# Patient Record
Sex: Female | Born: 1965 | Race: White | Hispanic: No | Marital: Married | Smoking: Never smoker
Health system: Southern US, Community
[De-identification: ages and names within clinical notes are randomized; demographics above are authoritative.]

## PROBLEM LIST (undated history)

## (undated) DIAGNOSIS — J309 Allergic rhinitis, unspecified: Secondary | ICD-10-CM

## (undated) DIAGNOSIS — B019 Varicella without complication: Secondary | ICD-10-CM

## (undated) DIAGNOSIS — K219 Gastro-esophageal reflux disease without esophagitis: Secondary | ICD-10-CM

## (undated) DIAGNOSIS — Z8489 Family history of other specified conditions: Secondary | ICD-10-CM

## (undated) DIAGNOSIS — R638 Other symptoms and signs concerning food and fluid intake: Secondary | ICD-10-CM

## (undated) HISTORY — DX: Other symptoms and signs concerning food and fluid intake: R63.8

## (undated) HISTORY — DX: Varicella without complication: B01.9

## (undated) HISTORY — DX: Allergic rhinitis, unspecified: J30.9

## (undated) HISTORY — DX: Gastro-esophageal reflux disease without esophagitis: K21.9

## (undated) HISTORY — PX: NO PAST SURGERIES: SHX2092

---

## 2014-06-12 LAB — HM MAMMOGRAPHY

## 2014-06-25 LAB — HM PAP SMEAR: HM Pap smear: NEGATIVE

## 2015-02-04 ENCOUNTER — Ambulatory Visit (INDEPENDENT_AMBULATORY_CARE_PROVIDER_SITE_OTHER): Payer: 59 | Admitting: Primary Care

## 2015-02-04 ENCOUNTER — Encounter (INDEPENDENT_AMBULATORY_CARE_PROVIDER_SITE_OTHER): Payer: Self-pay

## 2015-02-04 ENCOUNTER — Encounter: Payer: Self-pay | Admitting: Primary Care

## 2015-02-04 VITALS — BP 116/76 | HR 62 | Temp 97.7°F | Ht 63.0 in | Wt 163.8 lb

## 2015-02-04 DIAGNOSIS — K219 Gastro-esophageal reflux disease without esophagitis: Secondary | ICD-10-CM | POA: Diagnosis not present

## 2015-02-04 DIAGNOSIS — R002 Palpitations: Secondary | ICD-10-CM | POA: Diagnosis not present

## 2015-02-04 DIAGNOSIS — J302 Other seasonal allergic rhinitis: Secondary | ICD-10-CM

## 2015-02-04 DIAGNOSIS — Z833 Family history of diabetes mellitus: Secondary | ICD-10-CM | POA: Diagnosis not present

## 2015-02-04 DIAGNOSIS — J309 Allergic rhinitis, unspecified: Secondary | ICD-10-CM | POA: Insufficient documentation

## 2015-02-04 DIAGNOSIS — Z0000 Encounter for general adult medical examination without abnormal findings: Secondary | ICD-10-CM

## 2015-02-04 NOTE — Assessment & Plan Note (Signed)
Managed on daily nexium with relief of symptoms.

## 2015-02-04 NOTE — Assessment & Plan Note (Signed)
Present intermittently x 1 year. Endorses high stress job with increased stress at work. ECG today unremarkable, will complete TSH, CBC, CMP to rule out metabolic causes. Suspect this is stress related. GAD 7 score of 12, although she doesn't feel as though she's extremely anxious. Will continue to monitor.

## 2015-02-04 NOTE — Progress Notes (Signed)
Subjective:    Patient ID: Christine Schroeder, female    DOB: 09-20-1965, 49 y.o.   MRN: [id number]  HPI  Christine Schroeder is a 49 year old female who presents today to establish care and discuss the problems mentioned below. Will obtain old records. Her last full physical was over 2-3 years ago but she routinely sees her GYN.  1) GERD/Ulcers: Currently managed on Nexium 20 mg daily with complete relief. Her symptoms include epigastric discomfort.   2) Allergic Rhinitis: Currently managed on Zyrtec 10 mg for several years.  3) Puritis: Currently takes Benadryl 25 mg every morning prior to her shower as she will experience moderate intensity itching to bilateral lower extremities from the patella down to her feet. She is seeing an allergist and dermatologist for this.  4) Family history of diabetes: Present on mother's side, but mother does not have. She has her fasting blood sugar checked every year.   5) Palpitations: Intermittently for the past year. They occur during the day and at night without regards to activity. Will experience sensations of her heart pounding that will last for several minutes. She endorses a high stress job and has been experiencing an increased amount of stress at work. GAD score of 12 in clinic today. Denies shortness of breath, dizziness, headaches, depression. She doesn't feel as though she has difficulty with anxiety.   Review of Systems  Constitutional: Negative for unexpected weight change.  HENT: Negative for rhinorrhea.   Respiratory: Negative for cough and shortness of breath.   Cardiovascular: Negative for chest pain.  Gastrointestinal: Negative for diarrhea and constipation.  Musculoskeletal: Negative for myalgias and arthralgias.  Skin: Negative for rash.  Allergic/Immunologic: Positive for environmental allergies.  Neurological: Negative for dizziness, numbness and headaches.  Psychiatric/Behavioral:       Denies concerns for anxiety or depression.        Past Medical History  Diagnosis Date  . Chickenpox   . Allergic rhinitis   . GERD (gastroesophageal reflux disease)     Social History   Social History  . Marital Status: Unknown    Spouse Name: N/A  . Number of Children: N/A  . Years of Education: N/A   Occupational History  . Not on file.   Social History Main Topics  . Smoking status: Never Smoker   . Smokeless tobacco: Not on file  . Alcohol Use: No  . Drug Use: Not on file  . Sexual Activity: Not on file   Other Topics Concern  . Not on file   Social History Narrative   Married.   No children.   Works for [employer] in [employer].   Enjoys hiking, spending time outdoors, reading, golfing.     History reviewed. No pertinent past surgical history.  Family History  Problem Relation Age of Onset  . Hypertension Mother   . Heart disease Maternal Uncle   . Stroke Maternal Grandmother   . Diabetes Maternal Grandmother   . Diabetes Maternal Grandfather     Allergies  Allergen Reactions  . Etodolac Other (See Comments)  . Meloxicam Other (See Comments)  . Penicillins Hives  . Prednisone Rash    Abdominal pain    No current outpatient prescriptions on file prior to visit.   No current facility-administered medications on file prior to visit.    BP 116/76 mmHg  Pulse 62  Temp(Src) 97.7 F (36.5 C) (Oral)  Ht 5\' 3"  (1.6 m)  Wt 163 lb 12.8 oz (74.299  kg)  BMI 29.02 kg/m2  SpO2 98%  LMP 02/03/2015    Objective:   Physical Exam  Constitutional: She is oriented to person, place, and time. She appears well-nourished.  Cardiovascular: Normal rate and regular rhythm.   Pulmonary/Chest: Effort normal and breath sounds normal.  Neurological: She is alert and oriented to person, place, and time.  Skin: Skin is warm and dry.  Psychiatric: She has a normal mood and affect.          Assessment & Plan:  Puritis:  Present to bilateral lower extremities each time she  showers. Currently managed by dermatology and allergist.  ECG: Sinus bradycardia at 56. No ST elevation, PAC's, PVC's.

## 2015-02-04 NOTE — Patient Instructions (Signed)
Your ECG looks good. Your palpitations are likely stress related but we will check labs today to rule out other causes. Please work to reduce stress.  Complete lab work prior to leaving today. I will notify you of your results.  Please schedule a physical with me before the end of the year. You will also schedule a lab only appointment one week prior. We will discuss your lab results during your physical.  It was a pleasure to meet you today! Please don't hesitate to call me with any questions. Welcome to [facility]!

## 2015-02-04 NOTE — Progress Notes (Signed)
Pre visit review using our clinic review tool, if applicable. No additional management support is needed unless otherwise documented below in the visit note. 

## 2015-02-04 NOTE — Assessment & Plan Note (Signed)
Managed on daily zyrtec 10 mg with control of symptoms.

## 2015-02-05 LAB — COMPREHENSIVE METABOLIC PANEL
ALT: 21 IU/L (ref 0–32)
AST: 26 IU/L (ref 0–40)
Albumin/Globulin Ratio: 1.7 (ref 1.1–2.5)
Albumin: 4.3 g/dL (ref 3.5–5.5)
Alkaline Phosphatase: 28 IU/L — ABNORMAL LOW (ref 39–117)
BUN/Creatinine Ratio: 21 (ref 9–23)
BUN: 16 mg/dL (ref 6–24)
Bilirubin Total: 0.2 mg/dL (ref 0.0–1.2)
CO2: 23 mmol/L (ref 18–29)
Calcium: 9.5 mg/dL (ref 8.7–10.2)
Chloride: 101 mmol/L (ref 97–106)
Creatinine, Ser: 0.75 mg/dL (ref 0.57–1.00)
GFR calc Af Amer: 109 mL/min/{1.73_m2} (ref >59)
GFR calc non Af Amer: 95 mL/min/{1.73_m2} (ref >59)
Globulin, Total: 2.6 g/dL (ref 1.5–4.5)
Glucose: 87 mg/dL (ref 65–99)
Potassium: 4.2 mmol/L (ref 3.5–5.2)
Sodium: 143 mmol/L (ref 136–144)
Total Protein: 6.9 g/dL (ref 6.0–8.5)

## 2015-02-05 LAB — CBC WITH DIFFERENTIAL/PLATELET
Basophils Absolute: 0 10*3/uL (ref 0.0–0.2)
Basos: 0 %
EOS (ABSOLUTE): 0.3 10*3/uL (ref 0.0–0.4)
Eos: 3 %
Hematocrit: 40.6 % (ref 34.0–46.6)
Hemoglobin: 13.8 g/dL (ref 11.1–15.9)
Immature Grans (Abs): 0 10*3/uL (ref 0.0–0.1)
Immature Granulocytes: 0 %
Lymphocytes Absolute: 3.2 10*3/uL — ABNORMAL HIGH (ref 0.7–3.1)
Lymphs: 35 %
MCH: 30.7 pg (ref 26.6–33.0)
MCHC: 34 g/dL (ref 31.5–35.7)
MCV: 90 fL (ref 79–97)
Monocytes Absolute: 0.8 10*3/uL (ref 0.1–0.9)
Monocytes: 8 %
Neutrophils Absolute: 4.9 10*3/uL (ref 1.4–7.0)
Neutrophils: 54 %
Platelets: 301 10*3/uL (ref 150–379)
RBC: 4.5 x10E6/uL (ref 3.77–5.28)
RDW: 12.8 % (ref 12.3–15.4)
WBC: 9.2 10*3/uL (ref 3.4–10.8)

## 2015-02-05 LAB — LIPID PANEL
Chol/HDL Ratio: 4 ratio units (ref 0.0–4.4)
Cholesterol, Total: 200 mg/dL — ABNORMAL HIGH (ref 100–199)
HDL: 50 mg/dL (ref >39)
LDL Calculated: 135 mg/dL — ABNORMAL HIGH (ref 0–99)
Triglycerides: 77 mg/dL (ref 0–149)
VLDL Cholesterol Cal: 15 mg/dL (ref 5–40)

## 2015-02-05 LAB — HEMOGLOBIN A1C
Est. average glucose Bld gHb Est-mCnc: 120 mg/dL
Hgb A1c MFr Bld: 5.8 % — ABNORMAL HIGH (ref 4.8–5.6)

## 2015-02-05 LAB — TSH: TSH: 0.838 u[IU]/mL (ref 0.450–4.500)

## 2015-02-05 LAB — VITAMIN B12: Vitamin B-12: 621 pg/mL (ref 211–946)

## 2015-02-05 LAB — VITAMIN D 25 HYDROXY (VIT D DEFICIENCY, FRACTURES): Vit D, 25-Hydroxy: 39.4 ng/mL (ref 30.0–100.0)

## 2015-02-09 ENCOUNTER — Encounter: Payer: Self-pay | Admitting: *Deleted

## 2015-03-03 ENCOUNTER — Ambulatory Visit (INDEPENDENT_AMBULATORY_CARE_PROVIDER_SITE_OTHER): Payer: 59 | Admitting: Family Medicine

## 2015-03-03 ENCOUNTER — Other Ambulatory Visit: Payer: Self-pay | Admitting: Family Medicine

## 2015-03-03 ENCOUNTER — Encounter: Payer: Self-pay | Admitting: Family Medicine

## 2015-03-03 VITALS — BP 112/78 | HR 94 | Temp 98.4°F | Ht 63.0 in | Wt 157.1 lb

## 2015-03-03 DIAGNOSIS — J029 Acute pharyngitis, unspecified: Secondary | ICD-10-CM | POA: Insufficient documentation

## 2015-03-03 LAB — POCT RAPID STREP A (OFFICE): Rapid Strep A Screen: NEGATIVE

## 2015-03-03 MED ORDER — AZITHROMYCIN 250 MG PO TABS: ORAL_TABLET | ORAL | Status: DC

## 2015-03-03 NOTE — Patient Instructions (Signed)
Your strep test was negative.  We are sending a culture for confirmation.  This is likely viral.  Given the holiday, I have sent in an antibiotic in case you fail to improve.  Take care  Dr. Lacinda Axon

## 2015-03-03 NOTE — Progress Notes (Signed)
   Subjective:  Patient ID: Christine Schroeder, female    DOB: 04-23-1965  Age: 49 y.o. MRN: [id number]  CC: Sore throat  HPI:  49 year old female presents to clinic today for an acute visit with complaints of sore throat.  Sore throat  Patient reports she developed severe sore throat earlier this morning.  She states that it is slowly been worsening.  No associated fevers or chills.  No other associated symptoms.  No exacerbating or relieving factors. No interventions tried.  Social Hx   Social History   Social History  . Marital Status: Unknown    Spouse Name: N/A  . Number of Children: N/A  . Years of Education: N/A   Social History Main Topics  . Smoking status: Never Smoker   . Smokeless tobacco: None  . Alcohol Use: No  . Drug Use: None  . Sexual Activity: Not Asked   Other Topics Concern  . None   Social History Narrative   Married.   No children.   Works for [employer] in [employer].   Enjoys hiking, spending time outdoors, reading, golfing.     Review of Systems  Constitutional: Negative for fever.  HENT: Positive for sore throat.    Objective:  BP 112/78 mmHg  Pulse 94  Temp(Src) 98.4 F (36.9 C) (Oral)  Ht 5\' 3"  (1.6 m)  Wt 157 lb 2 oz (71.271 kg)  BMI 27.84 kg/m2  SpO2 98%  LMP 02/03/2015  BP/Weight 03/03/2015 [id number]  Systolic BP [id number]  Diastolic BP 78 76  Wt. (Lbs) 157.13 163.8  BMI 27.84 29.02   Physical Exam  Constitutional: She appears well-developed. No distress.  HENT:  Head: Normocephalic and atraumatic.  Oropharynx with mild erythema. No exudate. Normal TMs bilaterally.  Cardiovascular: Normal rate and regular rhythm.   Pulmonary/Chest: Effort normal and breath sounds normal.  Neurological: She is alert.  Psychiatric: She has a normal mood and affect.  Vitals reviewed.   Assessment & Plan:   Problem List Items Addressed This Visit    Acute pharyngitis - Primary    Rapid strep negative. Sending  culture. Likely viral in origin. Patient adamant about antibiotics given the holiday coming up. I informed her that this is likely viral. Rx for azithromycin given if she fails to improve or worsens while we wait on the culture.      Relevant Orders   POCT rapid strep A (Completed)   Throat culture [facility])      Meds ordered this encounter  Medications  . azithromycin (ZITHROMAX) 250 MG tablet    Sig: 2 tablets on Day 1; then 1 tablet daily on days 2-5.    Dispense:  6 tablet    Refill:  0    Do not fill before 11/23.    Follow-up:   [place]

## 2015-03-03 NOTE — Assessment & Plan Note (Signed)
Rapid strep negative. Sending culture. Likely viral in origin. Patient adamant about antibiotics given the holiday coming up. I informed her that this is likely viral. Rx for azithromycin given if she fails to improve or worsens while we wait on the culture.

## 2015-03-05 LAB — CULTURE, GROUP A STREP: Organism ID, Bacteria: NORMAL

## 2015-03-21 LAB — LIPID PANEL
Chol/HDL Ratio: 3.7 ratio units (ref 0.0–4.4)
Cholesterol, Total: 168 mg/dL (ref 100–199)
HDL: 46 mg/dL (ref >39)
LDL Calculated: 103 mg/dL — ABNORMAL HIGH (ref 0–99)
Triglycerides: 94 mg/dL (ref 0–149)
VLDL Cholesterol Cal: 19 mg/dL (ref 5–40)

## 2015-03-21 LAB — HEMOGLOBIN A1C
Est. average glucose Bld gHb Est-mCnc: 120 mg/dL
Hgb A1c MFr Bld: 5.8 % — ABNORMAL HIGH (ref 4.8–5.6)

## 2015-03-21 LAB — VITAMIN D 25 HYDROXY (VIT D DEFICIENCY, FRACTURES): Vit D, 25-Hydroxy: 39.1 ng/mL (ref 30.0–100.0)

## 2015-03-21 LAB — VITAMIN B12: Vitamin B-12: >2000 pg/mL — ABNORMAL HIGH (ref 211–946)

## 2015-03-27 ENCOUNTER — Ambulatory Visit (INDEPENDENT_AMBULATORY_CARE_PROVIDER_SITE_OTHER): Payer: 59 | Admitting: Primary Care

## 2015-03-27 ENCOUNTER — Encounter: Payer: Self-pay | Admitting: Primary Care

## 2015-03-27 VITALS — BP 114/70 | HR 67 | Temp 98.0°F | Ht 63.0 in | Wt 155.0 lb

## 2015-03-27 DIAGNOSIS — Z0000 Encounter for general adult medical examination without abnormal findings: Secondary | ICD-10-CM | POA: Diagnosis not present

## 2015-03-27 DIAGNOSIS — R7303 Prediabetes: Secondary | ICD-10-CM | POA: Diagnosis not present

## 2015-03-27 NOTE — Progress Notes (Signed)
Pre visit review using our clinic review tool, if applicable. No additional management support is needed unless otherwise documented below in the visit note. 

## 2015-03-27 NOTE — Assessment & Plan Note (Signed)
A1C of 5.8 today. She is working towards a healthy lifestyle through exercise and diet. Commended her on her weight loss efforts. Will recheck in 3 months.

## 2015-03-27 NOTE — Patient Instructions (Signed)
Continue your efforts towards a healthy lifestyle through healthy diet and exercise.  Schedule a lab only appointment in 3 months for recheck of the diabetes test.  Follow up in 1 year for repeat physical or sooner if needed.  It was a pleasure to see you today!

## 2015-03-27 NOTE — Progress Notes (Signed)
Subjective:    Patient ID: Christine Schroeder, female    DOB: 02-03-1966, 49 y.o.   MRN: [id number]  HPI  Christine Schroeder is a 49 year old female who presents today for complete physical.  Immunizations: -Tetanus: Unsure, believes it's been over 10 years. Declines today. -Influenza: Completed in November 2016   Diet: Endorses a healthy diet and is under a weight loss management program. She is under her second month and follows once monthly.  Her diet consists of: Breakfast: Bagel, peanut butter, sugar free jam. Almond milk, fruit. Lunch: 2-3 oz of protein, starch (toast, starchy veggie), green vegetable or salad Dinner: 2-3 oz protein, no starches, 2 veggies. Snacks: Fruit, popcorn Desserts: None Beverages: Water, unsweet tea  Exercise: She exercises 6 days weekly.  Wt Readings from Last 3 Encounters:  03/27/15 155 lb (70.308 kg)  03/03/15 157 lb 2 oz (71.271 kg)  02/04/15 163 lb 12.8 oz (74.299 kg)     Eye exam: Completed in September 2016 Dental exam: Completes semi-annually. Pap Smear: Due in March 2017. Mammogram: Completed in March 2016.   Review of Systems  Constitutional: Negative for unexpected weight change.  HENT: Negative for rhinorrhea.   Respiratory: Negative for cough and shortness of breath.   Cardiovascular: Negative for chest pain.  Gastrointestinal: Negative for diarrhea and constipation.  Genitourinary: Negative for difficulty urinating.  Musculoskeletal: Negative for myalgias and arthralgias.  Skin: Negative for rash.  Allergic/Immunologic: Negative for environmental allergies.  Neurological: Negative for dizziness, numbness and headaches.  Psychiatric/Behavioral:       Denies concerns from anxiety or depression.        Past Medical History  Diagnosis Date  . Chickenpox   . Allergic rhinitis   . GERD (gastroesophageal reflux disease)     Social History   Social History  . Marital Status: Unknown    Spouse Name: N/A  . Number of  Children: N/A  . Years of Education: N/A   Occupational History  . Not on file.   Social History Main Topics  . Smoking status: Never Smoker   . Smokeless tobacco: Not on file  . Alcohol Use: No  . Drug Use: Not on file  . Sexual Activity: Not on file   Other Topics Concern  . Not on file   Social History Narrative   Married.   No children.   Works for [employer] in [employer].   Enjoys hiking, spending time outdoors, reading, golfing.     No past surgical history on file.  Family History  Problem Relation Age of Onset  . Hypertension Mother   . Heart disease Maternal Uncle   . Stroke Maternal Grandmother   . Diabetes Maternal Grandmother   . Diabetes Maternal Grandfather     Allergies  Allergen Reactions  . Etodolac Other (See Comments)  . Meloxicam Other (See Comments)  . Penicillins Hives  . Prednisone Rash    Abdominal pain    Current Outpatient Prescriptions on File Prior to Visit  Medication Sig Dispense Refill  . cetirizine (ZYRTEC) 10 MG tablet Take 10 mg by mouth daily.     . diphenhydrAMINE (BENADRYL) 25 mg capsule Take 25 mg by mouth daily.     Marland Kitchen esomeprazole (NEXIUM) 20 MG capsule Take 20 mg by mouth daily at 12 noon.    . norgestimate-ethinyl estradiol (PREVIFEM) 0.25-35 MG-MCG tablet Take 1 tablet by mouth daily.     . vitamin B-12 (CYANOCOBALAMIN) 1000 MCG tablet Take 1,000 mcg by  mouth daily.     No current facility-administered medications on file prior to visit.    BP 114/70 mmHg  Pulse 67  Temp(Src) 98 F (36.7 C) (Oral)  Ht 5\' 3"  (1.6 m)  Wt 155 lb (70.308 kg)  BMI 27.46 kg/m2  SpO2 99%  LMP 02/24/2015    Objective:   Physical Exam  Constitutional: She is oriented to person, place, and time. She appears well-nourished.  HENT:  Right Ear: Tympanic membrane and ear canal normal.  Left Ear: Tympanic membrane and ear canal normal.  Nose: Nose normal.  Mouth/Throat: Oropharynx is clear and moist.  Eyes: Conjunctivae  and EOM are normal. Pupils are equal, round, and reactive to light.  Neck: Neck supple. No thyromegaly present.  Cardiovascular: Normal rate and regular rhythm.   No murmur heard. Pulmonary/Chest: Effort normal and breath sounds normal. She has no rales.  Abdominal: Soft. Bowel sounds are normal. There is no tenderness.  Musculoskeletal: Normal range of motion.  Lymphadenopathy:    She has no cervical adenopathy.  Neurological: She is alert and oriented to person, place, and time. She has normal reflexes. No cranial nerve deficit.  Skin: Skin is warm and dry. No rash noted.  Psychiatric: She has a normal mood and affect.          Assessment & Plan:

## 2015-03-27 NOTE — Assessment & Plan Note (Addendum)
Tdap out of date and declines today. Flu, pap, mammogram UTD. Labs with improvement in cholesterol. Will have her stop vitamin B 12. Exam unremarkable. She is working towards a healthy lifestyle with exercise and diet. Follow up in 1 year for repeat physical.

## 2015-06-02 ENCOUNTER — Telehealth: Payer: Self-pay | Admitting: Primary Care

## 2015-06-02 NOTE — Telephone Encounter (Signed)
Print and sent.

## 2015-06-02 NOTE — Telephone Encounter (Signed)
Patient misplaced her lab order for her March lab work.  Patient wants to know if lab order can be faxed to [phone].  Fax machine is next to patient in her office.

## 2015-06-02 NOTE — Telephone Encounter (Signed)
See below. Will you please print and fax?

## 2015-06-02 NOTE — Telephone Encounter (Deleted)
Done and sent  

## 2015-06-23 ENCOUNTER — Other Ambulatory Visit: Payer: Self-pay | Admitting: Primary Care

## 2015-06-23 LAB — HGB A1C W/O EAG: Hgb A1c MFr Bld: 5.7 % — ABNORMAL HIGH (ref 4.8–5.6)

## 2015-06-30 ENCOUNTER — Ambulatory Visit (INDEPENDENT_AMBULATORY_CARE_PROVIDER_SITE_OTHER): Payer: 59 | Admitting: Obstetrics and Gynecology

## 2015-06-30 ENCOUNTER — Encounter: Payer: Self-pay | Admitting: Obstetrics and Gynecology

## 2015-06-30 VITALS — BP 112/81 | HR 81 | Ht 62.0 in | Wt 147.9 lb

## 2015-06-30 DIAGNOSIS — Z1231 Encounter for screening mammogram for malignant neoplasm of breast: Secondary | ICD-10-CM

## 2015-06-30 DIAGNOSIS — Z0000 Encounter for general adult medical examination without abnormal findings: Secondary | ICD-10-CM

## 2015-06-30 DIAGNOSIS — Z3041 Encounter for surveillance of contraceptive pills: Secondary | ICD-10-CM

## 2015-06-30 DIAGNOSIS — Z01419 Encounter for gynecological examination (general) (routine) without abnormal findings: Secondary | ICD-10-CM

## 2015-06-30 MED ORDER — NORGESTIMATE-ETH ESTRADIOL 0.25-35 MG-MCG PO TABS: 1.0000 | ORAL_TABLET | Freq: Every day | ORAL | Status: DC

## 2015-06-30 NOTE — Patient Instructions (Signed)
1. No Pap smear needed. 2. Mammogram ordered 3. Birth control pills refilled for 1 year 4. Continue with healthy eating and exercise 5. Return in 1 year for annual exam

## 2015-06-30 NOTE — Progress Notes (Signed)
Patient ID: Christine Schroeder, female   DOB: [birth year], 50 y.o.   MRN: [id number] ANNUAL PREVENTATIVE CARE GYN  ENCOUNTER NOTE  Subjective:       Christine Schroeder is a 50 y.o. G0P0000 female here for a routine annual gynecologic exam.  Current complaints: 1.  none    Patient is exercising 6 days a week. Prediabetes is improving with activity. Menstrual cycles are regular on OCPs without pain. Bowel and bladder function are normal.   Gynecologic History Patient's last menstrual period was 06/20/2015 (exact date). Contraception: OCP (estrogen/progesterone) Last Pap: 06/2014 neg/neg. Results were: normal Last mammogram: 07/09/2014 birad 1. Results were: normal  Obstetric History OB History  Gravida Para Term Preterm AB SAB TAB Ectopic Multiple Living  0 0 0 0 0 0 0 0 0 0         Past Medical History  Diagnosis Date  . Chickenpox   . Allergic rhinitis   . GERD (gastroesophageal reflux disease)   . Increased BMI     Past Surgical History  Procedure Laterality Date  . No past surgeries      Current Outpatient Prescriptions on File Prior to Visit  Medication Sig Dispense Refill  . cetirizine (ZYRTEC) 10 MG tablet Take 10 mg by mouth daily.     . diphenhydrAMINE (BENADRYL) 25 mg capsule Take 25 mg by mouth daily.     Marland Kitchen esomeprazole (NEXIUM) 20 MG capsule Take 20 mg by mouth daily at 12 noon.    . norgestimate-ethinyl estradiol (PREVIFEM) 0.25-35 MG-MCG tablet Take 1 tablet by mouth daily.     . vitamin B-12 (CYANOCOBALAMIN) 1000 MCG tablet Take 1,000 mcg by mouth daily.     No current facility-administered medications on file prior to visit.    Allergies  Allergen Reactions  . Etodolac Other (See Comments)  . Meloxicam Other (See Comments)  . Penicillins Hives  . Prednisone Rash    Abdominal pain    Social History   Social History  . Marital Status: Unknown    Spouse Name: N/A  . Number of Children: N/A  . Years of Education: N/A   Occupational History  . Not on  file.   Social History Main Topics  . Smoking status: Never Smoker   . Smokeless tobacco: Not on file  . Alcohol Use: No  . Drug Use: No  . Sexual Activity: Yes    Birth Control/ Protection: Pill   Other Topics Concern  . Not on file   Social History Narrative   Married.   No children.   Works for [employer] in [employer].   Enjoys hiking, spending time outdoors, reading, golfing.     Family History  Problem Relation Age of Onset  . Hypertension Mother   . Heart disease Maternal Uncle   . Stroke Maternal Grandmother   . Diabetes Maternal Grandmother   . Diabetes Maternal Grandfather   . Cancer Neg Hx     The following portions of the patient's history were reviewed and updated as appropriate: allergies, current medications, past family history, past medical history, past social history, past surgical history and problem list.  Review of Systems ROS Review of Systems - General ROS: negative for - chills, fatigue, fever, hot flashes, night sweats, weight gain or weight loss Psychological ROS: negative for - anxiety, decreased libido, depression, mood swings, physical abuse or sexual abuse Ophthalmic ROS: negative for - blurry vision, eye pain or loss of vision ENT ROS: negative for - headaches, hearing change,  visual changes or vocal changes Allergy and Immunology ROS: negative for - hives, itchy/watery eyes or seasonal allergies Hematological and Lymphatic ROS: negative for - bleeding problems, bruising, swollen lymph nodes or weight loss Endocrine ROS: negative for - galactorrhea, hair pattern changes, hot flashes, malaise/lethargy, mood swings, palpitations, polydipsia/polyuria, skin changes, temperature intolerance or unexpected weight changes Breast ROS: negative for - new or changing breast lumps or nipple discharge Respiratory ROS: negative for - cough or shortness of breath Cardiovascular ROS: negative for - chest pain, irregular heartbeat, palpitations or  shortness of breath Gastrointestinal ROS: no abdominal pain, change in bowel habits, or black or bloody stools Genito-Urinary ROS: no dysuria, trouble voiding, or hematuria Musculoskeletal ROS: negative for - joint pain or joint stiffness Neurological ROS: negative for - bowel and bladder control changes Dermatological ROS: negative for rash and skin lesion changes   Objective:   BP 112/81 mmHg  Pulse 81  Ht 5\' 2"  (1.575 m)  Wt 147 lb 14.4 oz (67.087 kg)  BMI 27.04 kg/m2  LMP 06/20/2015 (Exact Date) CONSTITUTIONAL: Well-developed, well-nourished female in no acute distress.  PSYCHIATRIC: Normal mood and affect. Normal behavior. Normal judgment and thought content. [place]: Alert and oriented to person, place, and time. Normal muscle tone coordination. No cranial nerve deficit noted. HENT:  Normocephalic, atraumatic, External right and left ear normal. Oropharynx is clear and moist EYES: Conjunctivae and EOM are normal. Pupils are equal, round, and reactive to light. No scleral icterus.  NECK: Normal range of motion, supple, no masses.  Normal thyroid.  SKIN: Skin is warm and dry. No rash noted. Not diaphoretic. No erythema. No pallor. CARDIOVASCULAR: Normal heart rate noted, regular rhythm, no murmur. RESPIRATORY: Clear to auscultation bilaterally. Effort and breath sounds normal, no problems with respiration noted. BREASTS: Symmetric in size. No masses, skin changes, nipple drainage, or lymphadenopathy. ABDOMEN: Soft, normal bowel sounds, no distention noted.  No tenderness, rebound or guarding.  BLADDER: Normal PELVIC:  External Genitalia: Normal  BUS: Normal  Vagina: Normal  Cervix: Normal  Uterus: Normal; Midplane, normal size and shape, mobile, nontender  Adnexa: Normal  RV: External Exam NormaI, No Rectal Masses and Normal Sphincter tone  MUSCULOSKELETAL: Normal range of motion. No tenderness.  No cyanosis, clubbing, or edema.  2+ distal pulses. LYMPHATIC: No Axillary,  Supraclavicular, or Inguinal Adenopathy.    Assessment:   Annual gynecologic examination 50 y.o. Contraception: OCP (estrogen/progesterone) Normal BMI-27   Plan:  Pap: Not needed Mammogram: Ordered Stool Guaiac Testing:  Not Indicated Labs: thru pcp Routine preventative health maintenance measures emphasized: Exercise/Diet/Weight control, Alcohol/Substance use risks, Stress Management and Safe Sex  Oral contraceptives refilled 1 year Will continue OCPs until age 34; then assess for Return to Clinic - 1 Year   Crystal [place], CMA [employer], PA-S Brayton Mars, MD   I have seen, interviewed, and examined the patient in conjunction with the [employer].A. student and affirm the diagnosis and management plan. Martin A. DeFrancesco, MD, FACOG   Note: This dictation was prepared with Dragon dictation along with smaller phrase technology. Any transcriptional errors that result from this process are unintentional.

## 2015-11-13 DIAGNOSIS — S43431A Superior glenoid labrum lesion of right shoulder, initial encounter: Secondary | ICD-10-CM | POA: Insufficient documentation

## 2015-11-13 DIAGNOSIS — M7581 Other shoulder lesions, right shoulder: Secondary | ICD-10-CM | POA: Insufficient documentation

## 2015-12-28 ENCOUNTER — Other Ambulatory Visit: Payer: Self-pay | Admitting: Surgery

## 2015-12-28 DIAGNOSIS — M7581 Other shoulder lesions, right shoulder: Secondary | ICD-10-CM

## 2015-12-28 DIAGNOSIS — S43431D Superior glenoid labrum lesion of right shoulder, subsequent encounter: Secondary | ICD-10-CM

## 2016-01-07 ENCOUNTER — Ambulatory Visit
Admission: RE | Admit: 2016-01-07 | Discharge: 2016-01-07 | Disposition: A | Discharge status: Home / Self Care | Payer: 59 | Source: Ambulatory Visit | Attending: Surgery | Admitting: Surgery

## 2016-01-07 DIAGNOSIS — S43431D Superior glenoid labrum lesion of right shoulder, subsequent encounter: Secondary | ICD-10-CM | POA: Diagnosis present

## 2016-01-07 DIAGNOSIS — M7581 Other shoulder lesions, right shoulder: Secondary | ICD-10-CM | POA: Insufficient documentation

## 2016-01-18 DIAGNOSIS — M75111 Incomplete rotator cuff tear or rupture of right shoulder, not specified as traumatic: Secondary | ICD-10-CM | POA: Insufficient documentation

## 2016-02-03 ENCOUNTER — Encounter: Payer: Self-pay | Admitting: *Deleted

## 2016-02-10 ENCOUNTER — Ambulatory Visit: Payer: 59 | Admitting: Anesthesiology

## 2016-02-10 ENCOUNTER — Ambulatory Visit
Admission: RE | Admit: 2016-02-10 | Discharge: 2016-02-10 | Disposition: A | Discharge status: Home / Self Care | Payer: 59 | Source: Ambulatory Visit | Attending: Surgery | Admitting: Surgery

## 2016-02-10 ENCOUNTER — Encounter
Admission: RE | Disposition: A | Discharge status: Home / Self Care | Payer: Self-pay | Source: Ambulatory Visit | Attending: Surgery

## 2016-02-10 DIAGNOSIS — X58XXXA Exposure to other specified factors, initial encounter: Secondary | ICD-10-CM | POA: Diagnosis not present

## 2016-02-10 DIAGNOSIS — K219 Gastro-esophageal reflux disease without esophagitis: Secondary | ICD-10-CM | POA: Diagnosis not present

## 2016-02-10 DIAGNOSIS — M75111 Incomplete rotator cuff tear or rupture of right shoulder, not specified as traumatic: Secondary | ICD-10-CM | POA: Diagnosis not present

## 2016-02-10 DIAGNOSIS — S43431A Superior glenoid labrum lesion of right shoulder, initial encounter: Secondary | ICD-10-CM | POA: Insufficient documentation

## 2016-02-10 HISTORY — DX: Family history of other specified conditions: Z84.89

## 2016-02-10 HISTORY — PX: SHOULDER ARTHROSCOPY: SHX128

## 2016-02-10 SURGERY — ARTHROSCOPY, SHOULDER
Anesthesia: Regional | Laterality: Right | Wound class: Clean

## 2016-02-10 MED ORDER — DEXAMETHASONE SODIUM PHOSPHATE 4 MG/ML IJ SOLN
INTRAMUSCULAR | Status: DC | PRN
  Administered 2016-02-10: 4 mg via PERINEURAL
  Administered 2016-02-10: 8 mg via INTRAVENOUS

## 2016-02-10 MED ORDER — ONDANSETRON HCL 4 MG/2ML IJ SOLN: 4.0000 mg | Freq: Once | INTRAMUSCULAR | Status: DC | PRN

## 2016-02-10 MED ORDER — FENTANYL CITRATE (PF) 100 MCG/2ML IJ SOLN: 25.0000 ug | INTRAMUSCULAR | Status: DC | PRN

## 2016-02-10 MED ORDER — METOCLOPRAMIDE HCL 5 MG PO TABS
5.0000 mg | ORAL_TABLET | Freq: Three times a day (TID) | ORAL | Status: DC | PRN
Start: 2016-02-10 — End: 2016-02-10

## 2016-02-10 MED ORDER — METOCLOPRAMIDE HCL 5 MG/ML IJ SOLN: 5.0000 mg | Freq: Three times a day (TID) | INTRAMUSCULAR | Status: DC | PRN

## 2016-02-10 MED ORDER — BUPIVACAINE-EPINEPHRINE (PF) 0.5% -1:200000 IJ SOLN
INTRAMUSCULAR | Status: DC | PRN
  Administered 2016-02-10: 20 mL via PERINEURAL

## 2016-02-10 MED ORDER — ONDANSETRON HCL 4 MG PO TABS: 4.0000 mg | ORAL_TABLET | Freq: Four times a day (QID) | ORAL | Status: DC | PRN

## 2016-02-10 MED ORDER — OXYCODONE HCL 5 MG/5ML PO SOLN: 5.0000 mg | Freq: Once | ORAL | Status: DC | PRN

## 2016-02-10 MED ORDER — FENTANYL CITRATE (PF) 100 MCG/2ML IJ SOLN
INTRAMUSCULAR | Status: DC | PRN
  Administered 2016-02-10: 100 ug via INTRAVENOUS

## 2016-02-10 MED ORDER — LACTATED RINGERS IV SOLN
INTRAVENOUS | Status: DC
  Administered 2016-02-10 (×2): via INTRAVENOUS

## 2016-02-10 MED ORDER — ONDANSETRON HCL 4 MG/2ML IJ SOLN
INTRAMUSCULAR | Status: DC | PRN
  Administered 2016-02-10: 4 mg via INTRAVENOUS

## 2016-02-10 MED ORDER — CLINDAMYCIN PHOSPHATE 900 MG/50ML IV SOLN
900.0000 mg | Freq: Once | INTRAVENOUS | Status: AC
  Administered 2016-02-10: 900 mg via INTRAVENOUS

## 2016-02-10 MED ORDER — ACETAMINOPHEN 160 MG/5ML PO SOLN: 325.0000 mg | ORAL | Status: DC | PRN

## 2016-02-10 MED ORDER — EPHEDRINE SULFATE 50 MG/ML IJ SOLN
INTRAMUSCULAR | Status: DC | PRN
  Administered 2016-02-10 (×6): 5 mg via INTRAVENOUS

## 2016-02-10 MED ORDER — POTASSIUM CHLORIDE IN NACL 20-0.9 MEQ/L-% IV SOLN: INTRAVENOUS | Status: DC

## 2016-02-10 MED ORDER — OXYCODONE HCL 5 MG PO TABS: 5.0000 mg | ORAL_TABLET | ORAL | 0 refills | Status: DC | PRN

## 2016-02-10 MED ORDER — ONDANSETRON HCL 4 MG/2ML IJ SOLN: 4.0000 mg | Freq: Four times a day (QID) | INTRAMUSCULAR | Status: DC | PRN

## 2016-02-10 MED ORDER — PROPOFOL 10 MG/ML IV BOLUS
INTRAVENOUS | Status: DC | PRN
  Administered 2016-02-10: 200 mg via INTRAVENOUS

## 2016-02-10 MED ORDER — MIDAZOLAM HCL 2 MG/2ML IJ SOLN
INTRAMUSCULAR | Status: DC | PRN
  Administered 2016-02-10: 2 mg via INTRAVENOUS

## 2016-02-10 MED ORDER — LIDOCAINE HCL (CARDIAC) 20 MG/ML IV SOLN
INTRAVENOUS | Status: DC | PRN
  Administered 2016-02-10: 50 mg via INTRATRACHEAL

## 2016-02-10 MED ORDER — OXYCODONE HCL 5 MG PO TABS: 5.0000 mg | ORAL_TABLET | ORAL | Status: DC | PRN

## 2016-02-10 MED ORDER — ROPIVACAINE HCL 5 MG/ML IJ SOLN
INTRAMUSCULAR | Status: DC | PRN
  Administered 2016-02-10: 35 mL via PERINEURAL

## 2016-02-10 MED ORDER — SCOPOLAMINE 1 MG/3DAYS TD PT72
1.0000 | MEDICATED_PATCH | Freq: Once | TRANSDERMAL | Status: DC
  Administered 2016-02-10: 1.5 mg via TRANSDERMAL

## 2016-02-10 MED ORDER — OXYCODONE HCL 5 MG PO TABS: 5.0000 mg | ORAL_TABLET | Freq: Once | ORAL | Status: DC | PRN

## 2016-02-10 MED ORDER — ACETAMINOPHEN 325 MG PO TABS: 325.0000 mg | ORAL_TABLET | ORAL | Status: DC | PRN

## 2016-02-10 SURGICAL SUPPLY — 45 items
ANCH SUT 2 2.9 2 LD TPR NDL (Anchor) ×2 IMPLANT
ANCH SUT 2 2/0 ABS BRD STRL (Anchor) ×2 IMPLANT
ANCH SUT KNTLS STRL SHLDR SYS (Anchor) ×1 IMPLANT
ANCHOR JUGGERKNOT WTAP NDL 2.9 (Anchor) ×4 IMPLANT
ANCHOR SUT QUATTRO KNTLS 4.5 (Anchor) ×2 IMPLANT
ANCHOR SUT W/ ORTHOCORD (Anchor) ×4 IMPLANT
BIT DRILL JUGRKNT W/NDL BIT2.9 (DRILL) IMPLANT
BLADE FULL RADIUS 3.5 (BLADE) ×3 IMPLANT
BUR ACROMIONIZER 4.0 (BURR) ×3 IMPLANT
CANNULA SHAVER 8MMX76MM (CANNULA) ×5 IMPLANT
CHLORAPREP W/TINT 26ML (MISCELLANEOUS) ×4 IMPLANT
COVER LIGHT HANDLE UNIVERSAL (MISCELLANEOUS) ×6 IMPLANT
COVER MAYO STAND STRL (DRAPES) ×3 IMPLANT
DRAPE IMP U-DRAPE 54X76 (DRAPES) ×6 IMPLANT
DRILL JUGGERKNOT W/NDL BIT 2.9 (DRILL) ×3
GAUZE PETRO XEROFOAM 1X8 (MISCELLANEOUS) ×3 IMPLANT
GAUZE SPONGE 4X4 12PLY STRL (GAUZE/BANDAGES/DRESSINGS) ×3 IMPLANT
GLOVE BIO SURGEON STRL SZ8 (GLOVE) ×6 IMPLANT
GLOVE INDICATOR 8.0 STRL GRN (GLOVE) ×3 IMPLANT
GOWN STRL REUS W/ TWL LRG LVL3 (GOWN DISPOSABLE) ×1 IMPLANT
GOWN STRL REUS W/ TWL XL LVL3 (GOWN DISPOSABLE) ×1 IMPLANT
GOWN STRL REUS W/TWL LRG LVL3 (GOWN DISPOSABLE) ×3
GOWN STRL REUS W/TWL XL LVL3 (GOWN DISPOSABLE) ×3
GRASPER SUT 15 45D LOW PRO (SUTURE) ×2 IMPLANT
IV LACTATED RINGER IRRG 3000ML (IV SOLUTION) ×3
IV LR IRRIG 3000ML ARTHROMATIC (IV SOLUTION) ×2 IMPLANT
MANIFOLD 4PT FOR NEPTUNE1 (MISCELLANEOUS) ×3 IMPLANT
MAT BLUE FLOOR 46X72 FLO (MISCELLANEOUS) ×3 IMPLANT
NDL HYPO 21X1.5 SAFETY (NEEDLE) ×1 IMPLANT
NDL REVERSE CUT 1/2 CRC (NEEDLE) ×1 IMPLANT
NEEDLE HYPO 21X1.5 SAFETY (NEEDLE) ×3 IMPLANT
NEEDLE REVERSE CUT 1/2 CRC (NEEDLE) ×3 IMPLANT
PACK ARTHROSCOPY SHOULDER (MISCELLANEOUS) ×3 IMPLANT
PAD GROUND ADULT SPLIT (MISCELLANEOUS) ×2 IMPLANT
SLING ULTRA II M (MISCELLANEOUS) ×2 IMPLANT
STAPLER SKIN PROX 35W (STAPLE) ×3 IMPLANT
STRAP BODY AND KNEE 60X3 (MISCELLANEOUS) ×6 IMPLANT
SUT ETHIBOND 0 MO6 C/R (SUTURE) ×2 IMPLANT
SUT VIC AB 2-0 CT1 27 (SUTURE) ×3
SUT VIC AB 2-0 CT1 TAPERPNT 27 (SUTURE) IMPLANT
TAPE MICROFOAM 4IN (TAPE) ×3 IMPLANT
TUBING ARTHRO INFLOW-ONLY STRL (TUBING) ×3 IMPLANT
TUBING CONNECTING 10 (TUBING) ×2 IMPLANT
TUBING CONNECTING 10' (TUBING) ×1
WAND HAND CNTRL MULTIVAC 90 (MISCELLANEOUS) ×3 IMPLANT

## 2016-02-10 NOTE — Discharge Instructions (Addendum)
Scopolamine skin patches What is this medicine? SCOPOLAMINE (skoe POL a meen) is used to prevent nausea and vomiting caused by motion sickness, anesthesia and surgery. This medicine may be used for other purposes; ask your health care provider or pharmacist if you have questions. What should I tell my health care provider before I take this medicine? They need to know if you have any of these conditions: -glaucoma -kidney or liver disease -an unusual or allergic reaction (especially skin allergy) to scopolamine, atropine, other medicines, foods, dyes, or preservatives -pregnant or trying to get pregnant -breast-feeding How should I use this medicine? This medicine is for external use only. Follow the directions on the prescription label. One patch contains enough medicine to prevent motion sickness for up to 3 days. Apply the patch at least 4 hours before you need it and only wear one disc at a time. Choose an area behind the ear, that is clean, dry, hairless and free from any cuts or irritation. Wipe the area with a clean dry tissue. Peel off the plastic backing of the skin patch, trying not to touch the adhesive side with your hands. Do not cut the patches. Firmly apply to the area you have chosen, with the metallic side of the patch to the skin and the tan-colored side showing. Once firmly in place, wash your hands well with soap and water. Remove the disc after 3 days, or sooner if you no longer need it. After removing the patch, wash your hands and the area behind your ear thoroughly with soap and water. The patch will still contain some medicine after use. To avoid accidental contact or ingestion by children or pets, fold the used patch in half with the sticky side together and throw away in the trash out of the reach of children and pets. If you need to use a second patch after you remove the first, place it behind the other ear. Talk to your pediatrician regarding the use of this medicine in  children. Special care may be needed. Overdosage: If you think you have taken too much of this medicine contact a poison control center or emergency room at once. NOTE: This medicine is only for you. Do not share this medicine with others. What if I miss a dose? Make sure you apply the patch at least 4 hours before you need it. You can apply it the night before traveling. What may interact with this medicine? -benztropine -bethanechol -medicines for anxiety or sleeping problems like diazepam or temazepam -medicines for hay fever and other allergies -medicines for mental depression -muscle relaxants This list may not describe all possible interactions. Give your health care provider a list of all the medicines, herbs, non-prescription drugs, or dietary supplements you use. Also tell them if you smoke, drink alcohol, or use illegal drugs. Some items may interact with your medicine. What should I watch for while using this medicine? Keep the patch dry, if possible, to prevent it from falling off. Limited contact with water, however, as in bathing or swimming, will not affect the system. If the patch falls off, throw it away and put a new one behind the other ear. You may get drowsy or dizzy. Do not drive, use machinery, or do anything that needs mental alertness until you know how this medicine affects you. Do not stand or sit up quickly, especially if you are an older patient. This reduces the risk of dizzy or fainting spells. Alcohol may interfere with the effect of this  medicine. Avoid alcoholic drinks. Your mouth may get dry. Chewing sugarless gum or sucking hard candy, and drinking plenty of water may help. Contact your doctor if the problem does not go away or is severe. This medicine may cause dry eyes and blurred vision. If you wear contact lenses you may feel some discomfort. Lubricating drops may help. See your eye doctor if the problem does not go away or is severe. If you are going to have  a magnetic resonance imaging (MRI) procedure, tell your MRI technician if you have this patch on your body. It must be removed before a MRI. What side effects may I notice from receiving this medicine? Side effects that you should report to your doctor or health care professional as soon as possible: -agitation, nervousness, confusion -blurred vision and other eye problems -dizziness, drowsiness -eye pain or redness in the whites of the eye -hallucinations -pain or difficulty passing urine -skin rash, itching -vomiting Side effects that usually do not require medical attention (report to your doctor or health care professional if they continue or are bothersome): -headache -nausea This list may not describe all possible side effects. Call your doctor for medical advice about side effects. You may report side effects to FDA at 1-800-FDA-1088. Where should I keep my medicine? Keep out of the reach of children. Store at room temperature between 20 and 25 degrees C (68 and 77 degrees F). Throw away any unused medicine after the expiration date. When you remove a patch, fold it and throw it in the trash as described above. NOTE: This sheet is a summary. It may not cover all possible information. If you have questions about this medicine, talk to your doctor, pharmacist, or health care provider.    2016, Elsevier/Gold Standard. (2011-08-25 13:31:48)   General Anesthesia, Adult, Care After Refer to this sheet in the next few weeks. These instructions provide you with information on caring for yourself after your procedure. Your health care provider may also give you more specific instructions. Your treatment has been planned according to current medical practices, but problems sometimes occur. Call your health care provider if you have any problems or questions after your procedure. WHAT TO EXPECT AFTER THE PROCEDURE After the procedure, it is typical to experience:  Sleepiness.  Nausea and  vomiting. HOME CARE INSTRUCTIONS  For the first 24 hours after general anesthesia:  Have a responsible person with you.  Do not drive a car. If you are alone, do not take public transportation.  Do not drink alcohol.  Do not take medicine that has not been prescribed by your health care provider.  Do not sign important papers or make important decisions.  You may resume a normal diet and activities as directed by your health care provider.  Change bandages (dressings) as directed.  If you have questions or problems that seem related to general anesthesia, call the hospital and ask for the anesthetist or anesthesiologist on call. SEEK MEDICAL CARE IF:  You have nausea and vomiting that continue the day after anesthesia.  You develop a rash. SEEK IMMEDIATE MEDICAL CARE IF:   You have difficulty breathing.  You have chest pain.  You have any allergic problems.   This information is not intended to replace advice given to you by your health care provider. Make sure you discuss any questions you have with your health care provider.   Document Released: 07/04/2000 Document Revised: 04/18/2014 Document Reviewed: 07/27/2011 Elsevier Interactive Patient Education [employer].  Keep  dressing dry and intact.  May shower after dressing changed on post-op day #4 (Sunday).  Cover staples with Band-Aids after drying off. Apply ice frequently to shoulder. Take Aleve 200 mg BID with meals for 7-10 days, then as necessary. Take oxycodone as prescribed when needed.  May supplement with ES Tylenol if necessary. Keep shoulder immobilizer on at all times except may remove for bathing purposes. Follow-up in 10-14 days or as scheduled.

## 2016-02-10 NOTE — Anesthesia Procedure Notes (Signed)
Procedure Name: LMA Insertion Date/Time: 02/10/2016 2:19 PM Performed by: Londell Moh Pre-anesthesia Checklist: Patient identified, Emergency Drugs available, Suction available, Timeout performed and Patient being monitored Patient Re-evaluated:Patient Re-evaluated prior to inductionOxygen Delivery Method: Circle system utilized Preoxygenation: Pre-oxygenation with 100% oxygen Intubation Type: IV induction LMA: LMA inserted LMA Size: 4.0 Number of attempts: 1 Placement Confirmation: positive ETCO2 and breath sounds checked- equal and bilateral Tube secured with: Tape

## 2016-02-10 NOTE — H&P (Signed)
Paper H&P to be scanned into permanent record. H&P reviewed. No changes. 

## 2016-02-10 NOTE — Transfer of Care (Signed)
Immediate Anesthesia Transfer of Care Note  Patient: Christine Schroeder  Procedure(s) Performed: Procedure(s): ARTHROSCOPY SHOULDER WITH DEBRIDEMENT DECOMPRESSION,SLAP REPAIR ROTATOR CUFF REPAIR AND POSSIBLE BICEPS TENODESIS (Right)  Patient Location: PACU  Anesthesia Type: Regional  Level of Consciousness: awake, alert  and patient cooperative  Airway and Oxygen Therapy: Patient Spontanous Breathing and Patient connected to supplemental oxygen  Post-op Assessment: Post-op Vital signs reviewed, Patient's Cardiovascular Status Stable, Respiratory Function Stable, Patent Airway and No signs of Nausea or vomiting  Post-op Vital Signs: Reviewed and stable  Complications: No apparent anesthesia complications

## 2016-02-10 NOTE — Anesthesia Postprocedure Evaluation (Signed)
Anesthesia Post Note  Patient: Christine Schroeder  Procedure(s) Performed: Procedure(s) (LRB): Limited arthroscopic debridement, arthroscopic SLAP tear, subacromial decompression, mini-open rotator cuff repair, and mini-open biceps tenodesis, right shoulder (Right)  Patient location during evaluation: PACU Anesthesia Type: Regional and General Level of consciousness: awake and alert and oriented Pain management: satisfactory to patient Vital Signs Assessment: post-procedure vital signs reviewed and stable Respiratory status: spontaneous breathing, nonlabored ventilation and respiratory function stable Cardiovascular status: blood pressure returned to baseline and stable Postop Assessment: Adequate PO intake and No signs of nausea or vomiting Anesthetic complications: no    Raliegh Ip

## 2016-02-10 NOTE — Anesthesia Procedure Notes (Signed)
Anesthesia Regional Block:  Interscalene brachial plexus block  Pre-Anesthetic Checklist: ,, timeout performed, Correct Patient, Correct Site, Correct Laterality, Correct Procedure, Correct Position, site marked, Risks and benefits discussed, Surgical consent,  Pre-op evaluation,  At surgeon's request  Laterality: Right  Prep: chloraprep       Needles:  Injection technique: Single-shot  Needle Type: Stimiplex     Needle Length: 10cm 10 cm Needle Gauge: 21 and 21 G    Additional Needles:  Procedures: ultrasound guided (picture in chart) Interscalene brachial plexus block Narrative:  Start time: 02/10/2016 12:41 PM End time: 02/10/2016 12:47 PM Injection made incrementally with aspirations every 5 mL.  Performed by: Personally  Anesthesiologist: Ronelle Nigh  Additional Notes: Functioning IV was confirmed and monitors applied. Ultrasound guidance: relevant anatomy identified, needle position confirmed, local anesthetic spread visualized around nerve(s)., vascular puncture avoided.  Image printed for medical record.  Negative aspiration and no paresthesias; incremental administration of local anesthetic. The patient tolerated the procedure well. Vitals signes recorded in RN notes.

## 2016-02-10 NOTE — Op Note (Signed)
02/10/2016  3:55 PM  Patient:   Christine Schroeder  Pre-Op Diagnosis:   Impingement/tendinopathy with a SLAP tear and possible rotator cuff tear, right shoulder.  Postoperative diagnosis: Impingement/tendinopathy with partial-thickness rotator cuff tear and SLAP tear, right shoulder.  Procedure: Limited arthroscopic debridement, arthroscopic SLAP tear, subacromial decompression, mini-open rotator cuff repair, and mini-open biceps tenodesis, right shoulder.  Anesthesia: General LMA with interscalene block placed preoperatively by the anesthesiologist.  Surgeon:   Pascal Lux, MD  Assistant:   None  Findings: As above. The labral tear extended from the 11:00 to the 1:30 position. The biceps tendon itself was in satisfactory condition. There was a small intrasubstance partial-thickness tear involving the anterior insertional fibers of the supraspinatus, but the remainder of the rotator cuff was in satisfactory condition. The articular surfaces of the glenoid and humerus both were in satisfactory condition.  Complications: None  Fluids:   1300 cc  Estimated blood loss: 5 cc  Tourniquet time: None  Drains: None  Closure: Staples   Brief clinical note: The patient is a 50 year old female with a history of right shoulder pain. The patient's symptoms have progressed despite medications, activity modification, etc. The patient's history and examination are consistent with impingement/tendinopathy with a SLAP tear and possible rotator cuff tear. These findings were confirmed by MRI scan. The patient presents at this time for definitive management of these shoulder symptoms.  Procedure: The patient underwent placement of an interscalene block by the anesthesiologist in the preoperative holding area before she was brought into the operating room and lain in the supine position. The patient then underwent general laryngeal mask anesthesia before being repositioned in  the beach chair position using the beach chair positioner. The right shoulder and upper extremity were prepped with ChloraPrep solution before being draped sterilely. Preoperative antibiotics were administered. A timeout was performed to confirm the proper surgical site before the expected portal sites and incision site were injected with 0.5% Sensorcaine with epinephrine. A posterior portal was created and the glenohumeral joint thoroughly inspected with the findings as described above. An anterior portal was created using an outside-in technique. The labrum and rotator cuff were further probed, again confirming the above-noted findings. Through a separate superolateral portal site which was created using an outside in technique, two Mytec BioKnotless anchors were placed in the 11:30 and 1:00 positions to effect the SLAP repair. Subsequent probing of the repair demonstrated excellent stability. The ArthroCare wand was inserted and used release the biceps from its labral attachment, as well as to obtain hemostasis and to "anneal" the labrum superiorly and anteriorly. The instruments were removed from the joint after suctioning the excess fluid.  The camera was repositioned through the posterior portal into the subacromial space. A separate lateral portal was created using an outside-in technique. The 3.5 mm full-radius resector was introduced and used to perform a subtotal bursectomy. The ArthroCare wand was then inserted and used to remove the periosteal tissue off the undersurface of the anterior third of the acromion as well as to recess the coracoacromial ligament from its attachment along the anterior and lateral margins of the acromion. The 4.0 mm acromionizing bur was introduced and used to complete the decompression by removing the undersurface of the anterior third of the acromion. The full radius resector was reintroduced to remove any residual bony debris before the ArthroCare wand was reintroduced to  obtain hemostasis. The instruments were then removed from the subacromial space after suctioning the excess fluid.  An approximately 4-5 cm  incision was made over the anterolateral aspect of the shoulder beginning at the anterolateral corner of the acromion and extending distally in line with the bicipital groove. This incision was carried down through the subcutaneous tissues to expose the deltoid fascia. The raphae between the anterior and middle thirds was identified and this plane developed to provide access into the subacromial space. Additional bursal tissues were debrided sharply using Metzenbaum scissors. The rotator cuff tear was readily identified both visually and by palpation. The bursal surface was incised with a #15 blade before margins were debrided sharply with a #15 blade and the exposed greater tuberosity roughened with a rongeur. The tear was repaired using a single Biomet 2.9 mm JuggerKnot anchor. These sutures were then brought back laterally and secured using a [employer] anchor to create a two-layer closure. An apparent watertight closure was obtained.  The bicipital groove was identified by palpation and opened for 1-1.5 cm. The biceps tendon stump was retrieved through this defect. The floor of the bicipital groove was roughened with a curet before another Biomet 2.9 mm JuggerKnot anchor was inserted. Both sets of sutures were passed through the biceps tendon and tied securely to effect the tenodesis. The bicipital sheath was reapproximated using two #0 Ethibond interrupted sutures, incorporating the biceps tendon to further reinforce the tenodesis.  The wound was copiously irrigated with sterile saline solution before the deltoid raphae was reapproximated using 2-0 Vicryl interrupted sutures. The subcutaneous tissues were closed in two layers using 2-0 Vicryl interrupted sutures before the skin was closed using staples. The portal sites also were closed using staples. A  sterile bulky dressing was applied to the shoulder before the arm was placed into a shoulder immobilizer. The patient was then awakened, extubated, and returned to the recovery room in satisfactory condition after tolerating the procedure well.

## 2016-02-10 NOTE — Anesthesia Preprocedure Evaluation (Signed)
Anesthesia Evaluation  Patient identified by MRN, date of birth, ID band Patient awake    Reviewed: Allergy & Precautions, H&P , NPO status , Patient's Chart, lab work & pertinent test results  Airway Mallampati: II  TM Distance: >3 FB Neck ROM: full    Dental no notable dental hx.    Pulmonary    Pulmonary exam normal        Cardiovascular Normal cardiovascular exam     Neuro/Psych    GI/Hepatic GERD  ,  Endo/Other    Renal/GU      Musculoskeletal   Abdominal   Peds  Hematology   Anesthesia Other Findings   Reproductive/Obstetrics                             Anesthesia Physical Anesthesia Plan  ASA: II  Anesthesia Plan: Regional   Post-op Pain Management:    Induction:   Airway Management Planned:   Additional Equipment:   Intra-op Plan:   Post-operative Plan:   Informed Consent: I have reviewed the patients History and Physical, chart, labs and discussed the procedure including the risks, benefits and alternatives for the proposed anesthesia with the patient or authorized representative who has indicated his/her understanding and acceptance.     Plan Discussed with:   Anesthesia Plan Comments:         Anesthesia Quick Evaluation

## 2016-02-10 NOTE — Progress Notes (Signed)
Assisted Mike Stella ANMD with right, ultrasound guided, supraclavicular block. Side rails up, monitors on throughout procedure. See vital signs in flow sheet. Tolerated Procedure well.  

## 2016-02-11 ENCOUNTER — Encounter: Payer: Self-pay | Admitting: Surgery

## 2016-06-15 ENCOUNTER — Ambulatory Visit (INDEPENDENT_AMBULATORY_CARE_PROVIDER_SITE_OTHER): Payer: 59 | Admitting: Primary Care

## 2016-06-15 ENCOUNTER — Encounter: Payer: Self-pay | Admitting: Primary Care

## 2016-06-15 VITALS — BP 114/70 | HR 82 | Temp 97.7°F | Ht 63.0 in | Wt 152.8 lb

## 2016-06-15 DIAGNOSIS — R8290 Unspecified abnormal findings in urine: Secondary | ICD-10-CM

## 2016-06-15 LAB — POC URINALSYSI DIPSTICK (AUTOMATED)
Bilirubin, UA: NEGATIVE
Blood, UA: NEGATIVE
Glucose, UA: NEGATIVE
Ketones, UA: NEGATIVE
Leukocytes, UA: NEGATIVE
Nitrite, UA: NEGATIVE
Protein, UA: NEGATIVE
Spec Grav, UA: 1.025
Urobilinogen, UA: NEGATIVE
pH, UA: 6

## 2016-06-15 NOTE — Progress Notes (Signed)
Subjective:    Patient ID: Christine Schroeder, female    DOB: [birth year], 51 y.o.   MRN: [id number]  HPI  Christine Schroeder is a 51 year old female who presents today with a chief complaint of foul smelling urine. She also reports that she feels like she has residual urine in her bladder after she urinates. Her foul smelling urine is typically during the first urination of the day and will improve/dissipate throughout the day. The smell has been present for the past 1-2 months, worse in the last 1 week. Denies urinary frequency, hematuria, vaginal discharge/itching, flank pain, changes in diet, changes in medications. She's cut back on her vitamins over the past 2-3 weeks as she read this may cause changes in urine smell. She has noticed her periods becoming infrequent over the past 6 months, she will have a period every 1-2 months. She is due to see her GYN later this month.  Review of Systems  Gastrointestinal: Negative for abdominal pain and nausea.  Genitourinary: Negative for difficulty urinating, dysuria, flank pain, frequency, hematuria, pelvic pain and vaginal discharge.       Foul smelling urine       Past Medical History:  Diagnosis Date  . Allergic rhinitis   . Chickenpox   . Family history of adverse reaction to anesthesia    Mother - PONV  . GERD (gastroesophageal reflux disease)   . Increased BMI      Social History   Social History  . Marital status: Married    Spouse name: N/A  . Number of children: N/A  . Years of education: N/A   Occupational History  . Not on file.   Social History Main Topics  . Smoking status: Never Smoker  . Smokeless tobacco: Never Used  . Alcohol use 0.0 oz/week     Comment: Holidays  . Drug use: No  . Sexual activity: Yes    Birth control/ protection: Pill   Other Topics Concern  . Not on file   Social History Narrative   Married.   No children.   Works for [employer] in [employer].   Enjoys hiking, spending time  outdoors, reading, golfing.     Past Surgical History:  Procedure Laterality Date  . NO PAST SURGERIES    . SHOULDER ARTHROSCOPY Right 02/10/2016   Procedure: Limited arthroscopic debridement, arthroscopic SLAP tear, subacromial decompression, mini-open rotator cuff repair, and mini-open biceps tenodesis, right shoulder;  Surgeon: Corky Mull, MD;  Location: [place];  Service: Orthopedics;  Laterality: Right;    Family History  Problem Relation Age of Onset  . Hypertension Mother   . Heart disease Maternal Uncle   . Stroke Maternal Grandmother   . Diabetes Maternal Grandmother   . Diabetes Maternal Grandfather   . Cancer Neg Hx     Allergies  Allergen Reactions  . Etodolac Other (See Comments)    Abdominal pain  . Meloxicam Other (See Comments)    Abdominal pain  . Penicillins Hives  . Prednisone Rash    Abdominal pain    Current Outpatient Prescriptions on File Prior to Visit  Medication Sig Dispense Refill  . cetirizine (ZYRTEC) 10 MG tablet Take 10 mg by mouth daily.     . diphenhydrAMINE (BENADRYL) 25 mg capsule Take 25 mg by mouth daily.     Marland Kitchen esomeprazole (NEXIUM) 20 MG capsule Take 20 mg by mouth daily at 12 noon.    Marland Kitchen LYSINE PO Take by mouth  daily.    Marland Kitchen MAGNESIUM PO Take by mouth daily.    . norgestimate-ethinyl estradiol (PREVIFEM) 0.25-35 MG-MCG tablet Take 1 tablet by mouth daily. 3 Package 4  . Pyridoxine HCl (VITAMIN B6 PO) Take by mouth daily.    . vitamin B-12 (CYANOCOBALAMIN) 1000 MCG tablet Take 1,000 mcg by mouth daily.    . Cholecalciferol (VITAMIN D PO) Take by mouth daily.    . Multiple Vitamin (MULTIVITAMIN) tablet Take 1 tablet by mouth daily.     No current facility-administered medications on file prior to visit.     BP 114/70   Pulse 82   Temp 97.7 F (36.5 C) (Oral)   Ht 5\' 3"  (1.6 m)   Wt 152 lb 12.8 oz (69.3 kg)   LMP 04/23/2016   SpO2 92%   BMI 27.07 kg/m    Objective:   Physical Exam  Constitutional: She appears  well-nourished. She does not appear ill.  Cardiovascular: Normal rate and regular rhythm.   Pulmonary/Chest: Effort normal and breath sounds normal.  Abdominal: Soft. Bowel sounds are normal. There is no tenderness. There is no CVA tenderness.  Skin: Skin is warm and dry.          Assessment & Plan:  Foul Smelling Urine/Residual Urine:  Present for the past 1-2 months, only in the AM with first urination. Is perimenopausal with irregular periods. UA today negative. Could be hormonal, will have her see her GYN as scheduled. Consider residual urine/incomplete bladder emptying as cause, if this is the case then this is worrisome for infection.  If GYN eval unremarkable, consider Urology eval for studies of incomplete bladder emptying. Check BMP today for renal function.   She will update after GYN appointment. Sheral Flow, NP

## 2016-06-15 NOTE — Progress Notes (Signed)
Pre visit review using our clinic review tool, if applicable. No additional management support is needed unless otherwise documented below in the visit note. 

## 2016-06-15 NOTE — Patient Instructions (Signed)
Your urine does not indicate infection.   Ensure you are consuming 64 ounces of water daily.  Please notify me if you develop pelvic pressure, increased urinary frequency, vaginal discharge, fevers, etc.  Follow up with your GYN as scheduled.   It was a pleasure to see you today!

## 2016-06-15 NOTE — Addendum Note (Signed)
Addended by: Jacqualin Combes on: 06/15/2016 05:28 PM   Modules accepted: Orders

## 2016-06-17 LAB — BASIC METABOLIC PANEL
BUN/Creatinine Ratio: 21 (ref 9–23)
BUN: 15 mg/dL (ref 6–24)
CO2: 24 mmol/L (ref 18–29)
Calcium: 9.3 mg/dL (ref 8.7–10.2)
Chloride: 100 mmol/L (ref 96–106)
Creatinine, Ser: 0.7 mg/dL (ref 0.57–1.00)
GFR calc Af Amer: 118 mL/min/{1.73_m2} (ref >59)
GFR calc non Af Amer: 102 mL/min/{1.73_m2} (ref >59)
Glucose: 90 mg/dL (ref 65–99)
Potassium: 4.3 mmol/L (ref 3.5–5.2)
Sodium: 141 mmol/L (ref 134–144)

## 2016-06-30 ENCOUNTER — Encounter: Payer: 59 | Admitting: Obstetrics and Gynecology

## 2016-06-30 NOTE — Progress Notes (Signed)
Patient ID: Christine Schroeder, female   DOB: 12-14-1965, 51 y.o.   MRN: [id number] ANNUAL PREVENTATIVE CARE GYN  ENCOUNTER NOTE  Subjective:       Christine Schroeder is a 51 y.o. G0P0000 female here for a routine annual gynecologic exam.  Current complaints: 1.  Vaginal odor- x 2 months- sulfa smell- no pain no d/c   u/a neg at pcp   Patient is exercising 6 days a week. Prediabetes is improving with activity. Menstrual cycles are regular on OCPs without pain. Bowel and bladder function are normal.   Gynecologic History No LMP recorded. Contraception: OCP (estrogen/progesterone) Last Pap: 06/2014 neg/neg. Results were: normal Last mammogram: 06/2015 birad 1. Results were: normal  Obstetric History OB History  Gravida Para Term Preterm AB Living  0 0 0 0 0 0  SAB TAB Ectopic Multiple Live Births  0 0 0 0          Past Medical History:  Diagnosis Date  . Allergic rhinitis   . Chickenpox   . Family history of adverse reaction to anesthesia    Mother - PONV  . GERD (gastroesophageal reflux disease)   . Increased BMI     Past Surgical History:  Procedure Laterality Date  . NO PAST SURGERIES    . SHOULDER ARTHROSCOPY Right 02/10/2016   Procedure: Limited arthroscopic debridement, arthroscopic SLAP tear, subacromial decompression, mini-open rotator cuff repair, and mini-open biceps tenodesis, right shoulder;  Surgeon: Corky Mull, MD;  Location: [facility];  Service: Orthopedics;  Laterality: Right;    Current Outpatient Prescriptions on File Prior to Visit  Medication Sig Dispense Refill  . cetirizine (ZYRTEC) 10 MG tablet Take 10 mg by mouth daily.     . Cholecalciferol (VITAMIN D PO) Take by mouth daily.    . diphenhydrAMINE (BENADRYL) 25 mg capsule Take 25 mg by mouth daily.     Marland Kitchen esomeprazole (NEXIUM) 20 MG capsule Take 20 mg by mouth daily at 12 noon.    Marland Kitchen LYSINE PO Take by mouth daily.    Marland Kitchen MAGNESIUM PO Take by mouth daily.    . Multiple Vitamin (MULTIVITAMIN)  tablet Take 1 tablet by mouth daily.    . norgestimate-ethinyl estradiol (PREVIFEM) 0.25-35 MG-MCG tablet Take 1 tablet by mouth daily. 3 Package 4  . Pyridoxine HCl (VITAMIN B6 PO) Take by mouth daily.    . vitamin B-12 (CYANOCOBALAMIN) 1000 MCG tablet Take 1,000 mcg by mouth daily.     No current facility-administered medications on file prior to visit.     Allergies  Allergen Reactions  . Etodolac Other (See Comments)    Abdominal pain  . Meloxicam Other (See Comments)    Abdominal pain  . Penicillins Hives  . Prednisone Rash    Abdominal pain    Social History   Social History  . Marital status: Married    Spouse name: N/A  . Number of children: N/A  . Years of education: N/A   Occupational History  . Not on file.   Social History Main Topics  . Smoking status: Never Smoker  . Smokeless tobacco: Never Used  . Alcohol use 0.0 oz/week     Comment: Holidays  . Drug use: No  . Sexual activity: Yes    Birth control/ protection: Pill   Other Topics Concern  . Not on file   Social History Narrative   Married.   No children.   Works for [employer] in [employer].   Enjoys hiking,  spending time outdoors, reading, golfing.     Family History  Problem Relation Age of Onset  . Hypertension Mother   . Heart disease Maternal Uncle   . Stroke Maternal Grandmother   . Diabetes Maternal Grandmother   . Diabetes Maternal Grandfather   . Cancer Neg Hx     The following portions of the patient's history were reviewed and updated as appropriate: allergies, current medications, past family history, past medical history, past social history, past surgical history and problem list.  Review of Systems ROS   Objective:   BP 118/72   Pulse 73   Ht 5\' 3"  (1.6 m)   Wt 155 lb 9.6 oz (70.6 kg)   LMP 06/17/2016 (Approximate)   BMI 27.56 kg/m  CONSTITUTIONAL: Well-developed, well-nourished female in no acute distress.  PSYCHIATRIC: Normal mood and affect. Normal  behavior. Normal judgment and thought content. [place]: Alert and oriented to person, place, and time. Normal muscle tone coordination. No cranial nerve deficit noted. HENT:  Normocephalic, atraumatic, External right and left ear normal.  EYES: Conjunctivae and EOM are normal No scleral icterus.  NECK: Normal range of motion, supple, no masses.  Normal thyroid.  SKIN: Skin is warm and dry. No rash noted. Not diaphoretic. No erythema. No pallor. CARDIOVASCULAR: Normal heart rate noted, regular rhythm, no murmur. RESPIRATORY: Clear to auscultation bilaterally. Effort and breath sounds normal, no problems with respiration noted. BREASTS: Symmetric in size. No masses, skin changes, nipple drainage, or lymphadenopathy. ABDOMEN: Soft, normal bowel sounds, no distention noted.  No tenderness, rebound or guarding.  BLADDER: Normal PELVIC:  External Genitalia: Normal  BUS: Normal  Vagina: Normal; no significant discharge  Cervix: Normal; no lesions; no cervical motion tenderness  Uterus: Normal; Midplane, normal size and shape, mobile, nontender  Adnexa: Normal  RV: External Exam NormaI, No Rectal Masses and Normal Sphincter tone  MUSCULOSKELETAL: Normal range of motion. No tenderness.  No cyanosis, clubbing, or edema.  2+ distal pulses. LYMPHATIC: No Axillary, Supraclavicular, or Inguinal Adenopathy.    Assessment:   Annual gynecologic examination 51 y.o. Contraception: OCP (estrogen/progesterone) Normal BMI-27 Climacteric   Plan:  Pap: Due 2019 Mammogram: Ordered Stool Guaiac Testing:  Not Indicated Labs: thru pcp Routine preventative health maintenance measures emphasized: Exercise/Diet/Weight control, Alcohol/Substance use risks, Stress Management and Safe Sex  Oral contraceptives refilled 1 year Will continue OCPs until age 51; Patient will stop OCPs for 6 weeks, have [place] drawn. If elevated, she will discontinue OCPs. Urine culture is obtained to rule out UTI Return to [place], CMA  Brayton Mars, MD  Note: This dictation was prepared with Dragon dictation along with smaller phrase technology. Any transcriptional errors that result from this process are unintentional.

## 2016-07-06 ENCOUNTER — Encounter: Payer: Self-pay | Admitting: Obstetrics and Gynecology

## 2016-07-06 ENCOUNTER — Ambulatory Visit (INDEPENDENT_AMBULATORY_CARE_PROVIDER_SITE_OTHER): Payer: 59 | Admitting: Obstetrics and Gynecology

## 2016-07-06 VITALS — BP 118/72 | HR 73 | Ht 63.0 in | Wt 155.6 lb

## 2016-07-06 DIAGNOSIS — Z01419 Encounter for gynecological examination (general) (routine) without abnormal findings: Secondary | ICD-10-CM | POA: Diagnosis not present

## 2016-07-06 DIAGNOSIS — N951 Menopausal and female climacteric states: Secondary | ICD-10-CM

## 2016-07-06 DIAGNOSIS — Z1231 Encounter for screening mammogram for malignant neoplasm of breast: Secondary | ICD-10-CM

## 2016-07-06 DIAGNOSIS — R8290 Unspecified abnormal findings in urine: Secondary | ICD-10-CM

## 2016-07-06 LAB — POCT URINALYSIS DIPSTICK
Bilirubin, UA: NEGATIVE
Blood, UA: NEGATIVE
Glucose, UA: NEGATIVE
Ketones, UA: NEGATIVE
Leukocytes, UA: NEGATIVE
Nitrite, UA: NEGATIVE
Protein, UA: NEGATIVE
Spec Grav, UA: 1.005 (ref 1.030–1.035)
Urobilinogen, UA: NEGATIVE (ref ?–2.0)
pH, UA: 7.5 (ref 5.0–8.0)

## 2016-07-06 MED ORDER — NORGESTIMATE-ETH ESTRADIOL 0.25-35 MG-MCG PO TABS: 1.0000 | ORAL_TABLET | Freq: Every day | ORAL | 4 refills | Status: DC

## 2016-07-08 NOTE — Patient Instructions (Signed)
1. No Pap smear done. Next Pap is due 2019 2. Mammogram is ordered 3. Stool guaiac card testing is not performed due to age 51. Screening labs will be obtained through primary care 5. Stop OCPs for 6 weeks; [place] test is to be drawn at that time; if [place] is elevated, she will discontinue birth control pills. 6. Continue with healthy eating and exercise 7. Return in 1 year  Health Maintenance, Female Adopting a healthy lifestyle and getting preventive care can go a long way to promote health and wellness. Talk with your health care provider about what schedule of regular examinations is right for you. This is a good chance for you to check in with your provider about disease prevention and staying healthy. In between checkups, there are plenty of things you can do on your own. Experts have done a lot of research about which lifestyle changes and preventive measures are most likely to keep you healthy. Ask your health care provider for more information. Weight and diet Eat a healthy diet  Be sure to include plenty of vegetables, fruits, low-fat dairy products, and lean protein.  Do not eat a lot of foods high in solid fats, added sugars, or salt.  Get regular exercise. This is one of the most important things you can do for your health.  Most adults should exercise for at least 150 minutes each week. The exercise should increase your heart rate and make you sweat (moderate-intensity exercise).  Most adults should also do strengthening exercises at least twice a week. This is in addition to the moderate-intensity exercise. Maintain a healthy weight  Body mass index (BMI) is a measurement that can be used to identify possible weight problems. It estimates body fat based on height and weight. Your health care provider can help determine your BMI and help you achieve or maintain a healthy weight.  For females 76 years of age and older:  A BMI below 18.5 is considered underweight.  A BMI of 18.5  to 24.9 is normal.  A BMI of 25 to 29.9 is considered overweight.  A BMI of 30 and above is considered obese. Watch levels of cholesterol and blood lipids  You should start having your blood tested for lipids and cholesterol at 50 years of age, then have this test every 5 years.  You may need to have your cholesterol levels checked more often if:  Your lipid or cholesterol levels are high.  You are older than 50 years of age.  You are at high risk for heart disease. Cancer screening Lung Cancer  Lung cancer screening is recommended for adults 53-93 years old who are at high risk for lung cancer because of a history of smoking.  A yearly low-dose CT scan of the lungs is recommended for people who:  Currently smoke.  Have quit within the past 15 years.  Have at least a 30-pack-year history of smoking. A pack year is smoking an average of one pack of cigarettes a day for 1 year.  Yearly screening should continue until it has been 15 years since you quit.  Yearly screening should stop if you develop a health problem that would prevent you from having lung cancer treatment. Breast Cancer  Practice breast self-awareness. This means understanding how your breasts normally appear and feel.  It also means doing regular breast self-exams. Let your health care provider know about any changes, no matter how small.  If you are in your 20s or 30s, you should  have a clinical breast exam (CBE) by a health care provider every 1-3 years as part of a regular health exam.  If you are 30 or older, have a CBE every year. Also consider having a breast X-ray (mammogram) every year.  If you have a family history of breast cancer, talk to your health care provider about genetic screening.  If you are at high risk for breast cancer, talk to your health care provider about having an MRI and a mammogram every year.  Breast cancer gene (BRCA) assessment is recommended for women who have family  members with BRCA-related cancers. BRCA-related cancers include:  Breast.  Ovarian.  Tubal.  Peritoneal cancers.  Results of the assessment will determine the need for genetic counseling and BRCA1 and BRCA2 testing. Cervical Cancer  Your health care provider may recommend that you be screened regularly for cancer of the pelvic organs (ovaries, uterus, and vagina). This screening involves a pelvic examination, including checking for microscopic changes to the surface of your cervix (Pap test). You may be encouraged to have this screening done every 3 years, beginning at age 39.  For women ages 58-65, health care providers may recommend pelvic exams and Pap testing every 3 years, or they may recommend the Pap and pelvic exam, combined with testing for human papilloma virus (HPV), every 5 years. Some types of HPV increase your risk of cervical cancer. Testing for HPV may also be done on women of any age with unclear Pap test results.  Other health care providers may not recommend any screening for nonpregnant women who are considered low risk for pelvic cancer and who do not have symptoms. Ask your health care provider if a screening pelvic exam is right for you.  If you have had past treatment for cervical cancer or a condition that could lead to cancer, you need Pap tests and screening for cancer for at least 20 years after your treatment. If Pap tests have been discontinued, your risk factors (such as having a new sexual partner) need to be reassessed to determine if screening should resume. Some women have medical problems that increase the chance of getting cervical cancer. In these cases, your health care provider may recommend more frequent screening and Pap tests. Colorectal Cancer  This type of cancer can be detected and often prevented.  Routine colorectal cancer screening usually begins at 50 years of age and continues through 50 years of age.  Your health care provider may recommend  screening at an earlier age if you have risk factors for colon cancer.  Your health care provider may also recommend using home test kits to check for hidden blood in the stool.  A small camera at the end of a tube can be used to examine your colon directly (sigmoidoscopy or colonoscopy). This is done to check for the earliest forms of colorectal cancer.  Routine screening usually begins at age 7.  Direct examination of the colon should be repeated every 5-10 years through 51 years of age. However, you may need to be screened more often if early forms of precancerous polyps or small growths are found. Skin Cancer  Check your skin from head to toe regularly.  Tell your health care provider about any new moles or changes in moles, especially if there is a change in a mole's shape or color.  Also tell your health care provider if you have a mole that is larger than the size of a pencil eraser.  Always use sunscreen.  Apply sunscreen liberally and repeatedly throughout the day.  Protect yourself by wearing long sleeves, pants, a wide-brimmed hat, and sunglasses whenever you are outside. Heart disease, diabetes, and high blood pressure  High blood pressure causes heart disease and increases the risk of stroke. High blood pressure is more likely to develop in:  People who have blood pressure in the high end of the normal range (130-139/85-89 mm Hg).  People who are overweight or obese.  People who are African American.  If you are 45-65 years of age, have your blood pressure checked every 3-5 years. If you are 51 years of age or older, have your blood pressure checked every year. You should have your blood pressure measured twice-once when you are at a hospital or clinic, and once when you are not at a hospital or clinic. Record the average of the two measurements. To check your blood pressure when you are not at a hospital or clinic, you can use:  An automated blood pressure machine at a  pharmacy.  A home blood pressure monitor.  If you are between 49 years and 45 years old, ask your health care provider if you should take aspirin to prevent strokes.  Have regular diabetes screenings. This involves taking a blood sample to check your fasting blood sugar level.  If you are at a normal weight and have a low risk for diabetes, have this test once every three years after 50 years of age.  If you are overweight and have a high risk for diabetes, consider being tested at a younger age or more often. Preventing infection Hepatitis B  If you have a higher risk for hepatitis B, you should be screened for this virus. You are considered at high risk for hepatitis B if:  You were born in a country where hepatitis B is common. Ask your health care provider which countries are considered high risk.  Your parents were born in a high-risk country, and you have not been immunized against hepatitis B (hepatitis B vaccine).  You have HIV or AIDS.  You use needles to inject street drugs.  You live with someone who has hepatitis B.  You have had sex with someone who has hepatitis B.  You get hemodialysis treatment.  You take certain medicines for conditions, including cancer, organ transplantation, and autoimmune conditions. Hepatitis C  Blood testing is recommended for:  Everyone born from [birth year] through 1964.  Anyone with known risk factors for hepatitis C. Sexually transmitted infections (STIs)  You should be screened for sexually transmitted infections (STIs) including gonorrhea and chlamydia if:  You are sexually active and are younger than 50 years of age.  You are older than 50 years of age and your health care provider tells you that you are at risk for this type of infection.  Your sexual activity has changed since you were last screened and you are at an increased risk for chlamydia or gonorrhea. Ask your health care provider if you are at risk.  If you do not have  HIV, but are at risk, it may be recommended that you take a prescription medicine daily to prevent HIV infection. This is called pre-exposure prophylaxis (PrEP). You are considered at risk if:  You are sexually active and do not regularly use condoms or know the HIV status of your partner(s).  You take drugs by injection.  You are sexually active with a partner who has HIV. Talk with your health care provider about whether you  are at high risk of being infected with HIV. If you choose to begin PrEP, you should first be tested for HIV. You should then be tested every 3 months for as long as you are taking PrEP. Pregnancy  If you are premenopausal and you may become pregnant, ask your health care provider about preconception counseling.  If you may become pregnant, take 400 to 800 micrograms (mcg) of folic acid every day.  If you want to prevent pregnancy, talk to your health care provider about birth control (contraception). Osteoporosis and menopause  Osteoporosis is a disease in which the bones lose minerals and strength with aging. This can result in serious bone fractures. Your risk for osteoporosis can be identified using a bone density scan.  If you are 68 years of age or older, or if you are at risk for osteoporosis and fractures, ask your health care provider if you should be screened.  Ask your health care provider whether you should take a calcium or vitamin D supplement to lower your risk for osteoporosis.  Menopause may have certain physical symptoms and risks.  Hormone replacement therapy may reduce some of these symptoms and risks. Talk to your health care provider about whether hormone replacement therapy is right for you. Follow these instructions at home:  Schedule regular health, dental, and eye exams.  Stay current with your immunizations.  Do not use any tobacco products including cigarettes, chewing tobacco, or electronic cigarettes.  If you are pregnant, do not  drink alcohol.  If you are breastfeeding, limit how much and how often you drink alcohol.  Limit alcohol intake to no more than 1 drink per day for nonpregnant women. One drink equals 12 ounces of beer, 5 ounces of wine, or 1 ounces of hard liquor.  Do not use street drugs.  Do not share needles.  Ask your health care provider for help if you need support or information about quitting drugs.  Tell your health care provider if you often feel depressed.  Tell your health care provider if you have ever been abused or do not feel safe at home. This information is not intended to replace advice given to you by your health care provider. Make sure you discuss any questions you have with your health care provider. Document Released: 10/11/2010 Document Revised: 09/03/2015 Document Reviewed: 12/30/2014 Elsevier Interactive Patient Education  2017 [employer].

## 2016-07-12 ENCOUNTER — Telehealth: Payer: Self-pay

## 2016-07-12 LAB — URINE CULTURE

## 2016-07-12 MED ORDER — NITROFURANTOIN MONOHYD MACRO 100 MG PO CAPS: 100.0000 mg | ORAL_CAPSULE | Freq: Two times a day (BID) | ORAL | 0 refills | Status: DC

## 2016-07-12 NOTE — Telephone Encounter (Signed)
Pt aware. Med erx. 

## 2016-07-12 NOTE — Telephone Encounter (Signed)
-----   Message from Brayton Mars, MD sent at 07/12/2016  2:11 PM EDT ----- Please notify - Abnormal Labs. Please call in macrobid twice daily x 7 days

## 2016-08-08 ENCOUNTER — Encounter: Payer: Self-pay | Admitting: Primary Care

## 2016-08-08 ENCOUNTER — Ambulatory Visit (INDEPENDENT_AMBULATORY_CARE_PROVIDER_SITE_OTHER): Payer: 59 | Admitting: Primary Care

## 2016-08-08 VITALS — BP 124/80 | HR 72 | Temp 97.9°F | Ht 63.0 in | Wt 149.1 lb

## 2016-08-08 DIAGNOSIS — J019 Acute sinusitis, unspecified: Secondary | ICD-10-CM

## 2016-08-08 MED ORDER — AZITHROMYCIN 250 MG PO TABS
ORAL_TABLET | ORAL | 0 refills | Status: DC
Start: 2016-08-08 — End: 2016-09-14

## 2016-08-08 NOTE — Progress Notes (Signed)
Subjective:    Patient ID: Christine Schroeder, female    DOB: 1965-08-21, 51 y.o.   MRN: [id number]  HPI  Christine Schroeder is a 51 year old female with a history of allergic rhinitis who presents today with a chief complaint of sinus pressure. She also reports sore throat, nasal congestion, cough, chest congestion, fevers. Symptoms began one week ago while out of the country for vacation. She took OTC medication in [place] without much improvement. She's blowing thick green mucous from her nasal cavity. Overall she's feeling worse.   Review of Systems  Constitutional: Positive for fatigue and fever. Negative for chills.  HENT: Positive for congestion, sinus pressure, sore throat and voice change.   Respiratory: Positive for cough. Negative for shortness of breath.   Cardiovascular: Negative for chest pain.       Past Medical History:  Diagnosis Date  . Allergic rhinitis   . Chickenpox   . Family history of adverse reaction to anesthesia    Mother - PONV  . GERD (gastroesophageal reflux disease)   . Increased BMI      Social History   Social History  . Marital status: Married    Spouse name: N/A  . Number of children: N/A  . Years of education: N/A   Occupational History  . Not on file.   Social History Main Topics  . Smoking status: Never Smoker  . Smokeless tobacco: Never Used  . Alcohol use 0.0 oz/week     Comment: Holidays  . Drug use: No  . Sexual activity: Yes    Birth control/ protection: Pill   Other Topics Concern  . Not on file   Social History Narrative   Married.   No children.   Works for [employer] in [employer].   Enjoys hiking, spending time outdoors, reading, golfing.     Past Surgical History:  Procedure Laterality Date  . NO PAST SURGERIES    . SHOULDER ARTHROSCOPY Right 02/10/2016   Procedure: Limited arthroscopic debridement, arthroscopic SLAP tear, subacromial decompression, mini-open rotator cuff repair, and mini-open biceps  tenodesis, right shoulder;  Surgeon: Corky Mull, MD;  Location: [facility];  Service: Orthopedics;  Laterality: Right;    Family History  Problem Relation Age of Onset  . Hypertension Mother   . Heart disease Maternal Uncle   . Stroke Maternal Grandmother   . Diabetes Maternal Grandmother   . Diabetes Maternal Grandfather   . Cancer Neg Hx     Allergies  Allergen Reactions  . Etodolac Other (See Comments)    Abdominal pain  . Meloxicam Other (See Comments)    Abdominal pain  . Penicillins Hives  . Prednisone Rash    Abdominal pain    Current Outpatient Prescriptions on File Prior to Visit  Medication Sig Dispense Refill  . cetirizine (ZYRTEC) 10 MG tablet Take 10 mg by mouth daily.     . Cholecalciferol (VITAMIN D PO) Take by mouth daily.    . diphenhydrAMINE (BENADRYL) 25 mg capsule Take 25 mg by mouth daily.     Marland Kitchen esomeprazole (NEXIUM) 20 MG capsule Take 20 mg by mouth daily at 12 noon.    Marland Kitchen LYSINE PO Take by mouth daily.    Marland Kitchen MAGNESIUM PO Take by mouth daily.    . Multiple Vitamin (MULTIVITAMIN) tablet Take 1 tablet by mouth daily.    . nitrofurantoin, macrocrystal-monohydrate, (MACROBID) 100 MG capsule Take 1 capsule (100 mg total) by mouth 2 (two) times daily.  14 capsule 0  . Pyridoxine HCl (VITAMIN B6 PO) Take by mouth daily.    . vitamin B-12 (CYANOCOBALAMIN) 1000 MCG tablet Take 1,000 mcg by mouth daily.    . norgestimate-ethinyl estradiol (PREVIFEM) 0.25-35 MG-MCG tablet Take 1 tablet by mouth daily. (Patient not taking: Reported on 08/08/2016) 3 Package 4   No current facility-administered medications on file prior to visit.     BP 124/80   Pulse 72   Temp 97.9 F (36.6 C) (Oral)   Ht 5\' 3"  (1.6 m)   Wt 149 lb 1.9 oz (67.6 kg)   LMP 07/12/2016   SpO2 99%   BMI 26.42 kg/m    Objective:   Physical Exam  Constitutional: She appears well-nourished. She appears ill.  HENT:  Right Ear: Tympanic membrane and ear canal normal.  Left Ear: Tympanic  membrane and ear canal normal.  Nose: Mucosal edema present. Right sinus exhibits maxillary sinus tenderness and frontal sinus tenderness. Left sinus exhibits maxillary sinus tenderness and frontal sinus tenderness.  Mouth/Throat: Oropharynx is clear and moist.  Eyes: Conjunctivae are normal.  Neck: Neck supple.  Cardiovascular: Normal rate and regular rhythm.   Pulmonary/Chest: Effort normal and breath sounds normal. She has no wheezes. She has no rales.  Lymphadenopathy:    She has no cervical adenopathy.  Skin: Skin is warm and dry.          Assessment & Plan:  Acute Sinusitis:  Sinus pressure, cough, chest congestion x 1 week. Overall no improvement with OTC treatment, feeling worse. Given recent foreign travel, presentation, and duration, will treat. Rx for Zpak sent to pharmacy (PCN allergy). Discussed use of Flonase, Delsym, Zyrtec PRN. Fluids, rest, follow up PRN.  Sheral Flow, NP

## 2016-08-08 NOTE — Progress Notes (Signed)
Pre visit review using our clinic review tool, if applicable. No additional management support is needed unless otherwise documented below in the visit note. 

## 2016-08-08 NOTE — Patient Instructions (Signed)
Start Azithromycin antibiotics. Take 2 tablets by mouth today, then 1 tablet daily for 4 additional days.  Cough/Congestion: Try Delsym DM. This may be purchased over the counter.  Nasal Congestion/Sinus Pressure: Try using Flonase (fluticasone) nasal spray. Instill 1 spray in each nostril twice daily.   It was a pleasure to see you today!

## 2016-08-25 LAB — FOLLICLE STIMULATING HORMONE: FSH: 41.9 m[IU]/mL

## 2016-09-14 ENCOUNTER — Encounter: Payer: Self-pay | Admitting: Obstetrics and Gynecology

## 2016-09-14 ENCOUNTER — Ambulatory Visit (INDEPENDENT_AMBULATORY_CARE_PROVIDER_SITE_OTHER): Payer: 59 | Admitting: Obstetrics and Gynecology

## 2016-09-14 VITALS — BP 116/81 | HR 86 | Ht 63.0 in | Wt 151.6 lb

## 2016-09-14 DIAGNOSIS — N951 Menopausal and female climacteric states: Secondary | ICD-10-CM | POA: Diagnosis not present

## 2016-09-14 MED ORDER — CONJ ESTROG-MEDROXYPROGEST ACE 0.625-2.5 MG PO TABS: 1.0000 | ORAL_TABLET | Freq: Every day | ORAL | 6 refills | Status: DC

## 2016-09-14 NOTE — Patient Instructions (Addendum)
1. Begin Prempro 0.625/2.5 mg daily 2. Return in 8 weeks for follow-up  Conjugated Estrogens; Medroxyprogesterone tablets What is this medicine? CONJUGATED ESTROGENS; MEDROXYPROGESTERONE (CON ju gate ed ESS troe jenz; me DROX ee proe JES te rone) is used as hormone replacement in menopausal women who still have their uterus. This medicine helps to treat hot flashes and prevent osteoporosis (weak bones). This medicine may be used for other purposes; ask your health care provider or pharmacist if you have questions. COMMON BRAND NAME(S): Premphase, Prempro What should I tell my health care provider before I take this medicine? They need to know if you have any of these conditions: -blood vessel disease or blood clots -breast, cervical, endometrial, or uterine cancer -diabetes -endometriosis -fibroids -gallbladder disease -heart disease or recent heart attack -high blood cholesterol -high blood pressure -high level of calcium in the blood -hysterectomy -kidney disease -liver disease -mental depression -migraine headaches -porphyria -protein C deficiency -protein S deficiency -stroke -systemic lupus erythematosus (SLE) -tobacco smoker -vaginal bleeding -an unusual or allergic reaction to estrogens, progestiins, other medicines, foods, dyes, or preservatives -pregnant or trying to get pregnant -breast-feeding How should I use this medicine? Take this medicine by mouth with a drink of water. You may take this medicine with food. Follow the directions on the prescription label. You will take one tablet daily at roughly the same time each day. Do not take your medicine more often than directed. Talk to your pediatrician regarding the use of this medicine in children. Special care may be needed. A patient package insert for the product will be given with each prescription and refill. Read this sheet carefully each time. The sheet may change frequently. Overdosage: If you think you have  taken too much of this medicine contact a poison control center or emergency room at once. NOTE: This medicine is only for you. Do not share this medicine with others. What if I miss a dose? If you miss a dose, take it as soon as you can. If it is almost time for your next dose, take only that dose. Do not take double or extra doses. What may interact with this medicine? -barbiturates, such as phenobarbital -benzodiazepines -bosentan -bromocriptine -carbamazepine -cimetidine -cyclosporine -dantrolene -grapefruit juice -griseofulvin -hydrocortisone, cortisone, or prednisolone -isoniazid (INH) -medications for diabetes -methotrexate -mineral oil -phenytoin -raloxifene -rifabutin, rifampin, or rifapentine -tamoxifen -thyroid hormones -topiramate -tricyclic antidepressants -warfarin This list may not describe all possible interactions. Give your health care provider a list of all the medicines, herbs, non-prescription drugs, or dietary supplements you use. Also tell them if you smoke, drink alcohol, or use illegal drugs. Some items may interact with your medicine. What should I watch for while using this medicine? Visit your health care professional for regular checks on your progress. You will need a regular breast and pelvic exam. You should also discuss the need for regular mammograms with your health care professional, and follow his or her guidelines. This medicine can make your body retain fluid, making your fingers, hands, or ankles swell. Your blood pressure can go up. Contact your doctor or health care professional if you feel you are retaining fluid. If you have any reason to think you are pregnant; stop taking this medicine at once and contact your doctor or health care professional. Tobacco smoking increases the risk of getting a blood clot or having a stroke, especially if you are more than 50 years old. You are strongly advised not to smoke. If you wear contact lenses and  notice visual changes, or if the lenses begin to feel uncomfortable, consult your eye care specialist. If you are going to have elective surgery, you may need to stop taking this medicine beforehand. Consult your health care professional for advice prior to scheduling the surgery. What side effects may I notice from receiving this medicine? Side effects that you should report to your doctor or health care professional as soon as possible: -allergic reactions like skin rash, itching or hives, swelling of the face, lips, or tongue -breast tissue changes or discharge -changes in vision -chest pain -confusion, trouble speaking or understanding -dark urine -general ill feeling or flu-like symptoms -light-colored stools -nausea, vomiting -pain, swelling, warmth in the leg -right upper belly pain -severe headaches -shortness of breath -sudden numbness or weakness of the face, arm or leg -trouble walking, dizziness, loss of balance or coordination -unusual vaginal bleeding -yellowing of the eyes or skin Side effects that usually do not require medical attention (report to your doctor or health care professional if they continue or are bothersome): -hair loss -increased hunger or thirst -increased urination -symptoms of vaginal infection like itching, irritation or unusual discharge -unusually weak or tired This list may not describe all possible side effects. Call your doctor for medical advice about side effects. You may report side effects to FDA at 1-800-FDA-1088. Where should I keep my medicine? Keep out of the reach of children. Store at room temperature between 15 and 30 degrees C (59 and 86 degrees F). Throw away any unused medicine after the expiration date. NOTE: This sheet is a summary. It may not cover all possible information. If you have questions about this medicine, talk to your doctor, pharmacist, or health care provider.  2018 Elsevier/Gold Standard (2010-06-30 09:20:18)

## 2016-09-14 NOTE — Progress Notes (Signed)
Chief complaint: Vasomotor symptoms  Patient has come off of oral contraceptives several months ago. FSH is 41.9. Patient is experiencing severe hot flashes and night sweats associated irritability and insomnia. She is desiring to go on HRT trial as over-the-counter remedies are ineffective.  Past Medical History:  Diagnosis Date  . Allergic rhinitis   . Chickenpox   . Family history of adverse reaction to anesthesia    Mother - PONV  . GERD (gastroesophageal reflux disease)   . Increased BMI     OBJECTIVE: BP 116/81   Pulse 86   Ht 5\' 3"  (1.6 m)   Wt 151 lb 9.6 oz (68.8 kg)   LMP 06/17/2016 (Approximate)   BMI 26.85 kg/m  Pleasant female in no acute distress. Affect is normal. Physical exam-deferred  ASSESSMENT: 1. Vasomotor symptoms due to menopause, desiring treatment  PLAN: 1. Begin Prempro 2.5 mg daily 2. Return in 8 weeks for follow-up 3. Recommend calcium with vitamin D supplementation daily for osteoporosis prevention 4. Patient has been counseled regarding the pros and cons of HRT therapy and she wishes to proceed with trial of medication  A total of 15 minutes were spent face-to-face with the patient during this encounter and over half of that time dealt with counseling and coordination of care.  Brayton Mars, MD  Note: This dictation was prepared with Dragon dictation along with smaller phrase technology. Any transcriptional errors that result from this process are unintentional.

## 2016-11-23 ENCOUNTER — Encounter: Payer: 59 | Admitting: Obstetrics and Gynecology

## 2016-12-08 ENCOUNTER — Ambulatory Visit (INDEPENDENT_AMBULATORY_CARE_PROVIDER_SITE_OTHER): Payer: 59 | Admitting: Obstetrics and Gynecology

## 2016-12-08 ENCOUNTER — Encounter: Payer: Self-pay | Admitting: Obstetrics and Gynecology

## 2016-12-08 VITALS — BP 121/66 | HR 73 | Ht 63.0 in | Wt 155.2 lb

## 2016-12-08 DIAGNOSIS — N951 Menopausal and female climacteric states: Secondary | ICD-10-CM

## 2016-12-08 DIAGNOSIS — Z79899 Other long term (current) drug therapy: Secondary | ICD-10-CM

## 2016-12-08 MED ORDER — CONJ ESTROG-MEDROXYPROGEST ACE 0.625-2.5 MG PO TABS: 1.0000 | ORAL_TABLET | Freq: Every day | ORAL | 2 refills | Status: DC

## 2016-12-08 NOTE — Progress Notes (Signed)
Patient ID: Christine Schroeder, female   DOB: [birth year], 51 y.o.   MRN: [id number]   Subjective:      Christine Schroeder  presents today for follow-up on Prempro therapy. She has had significant resolution of her hot flashes and night sweats. She is complaining of a 4 pound weight gain in the past 4 weeks.  Christine Schroeder has not changed her diet or exercise pattern. She denies onset of headaches, breast tenderness,appetite changes, etc.  Review of Systems Review of Systems  Constitutional:       4 pound Weight gain. No hot flashes or night sweats  Eyes: Negative.   Cardiovascular: Negative.   Gastrointestinal: Negative.   Genitourinary: Negative.   Musculoskeletal: Negative.   Skin: Negative.   Neurological: Negative.  Negative for headaches.  Endo/Heme/Allergies: Negative.   Psychiatric/Behavioral: Negative for depression. The patient is not nervous/anxious and does not have insomnia.      Objective:  BP 121/66   Pulse 73   Ht 5\' 3"  (1.6 m)   Wt 155 lb 3.2 oz (70.4 kg)   LMP 04/11/2016 (Approximate)   BMI 27.49 kg/m  Normal affect Physical exam-deferred  Assessment:  Menopausal vasomotor symptoms resolved with HRT   Plan:  1. Continue Prempro 0.625/2.5 mg daily 2. Encourage healthy eating and exercise with controlled weight loss 3. Return in 1 year for annual exam   A total of 15 minutes were spent face-to-face with the patient during this encounter and over half of that time dealt with counseling and coordination of care.  Brayton Mars, MD  Note: This dictation was prepared with Dragon dictation along with smaller phrase technology. Any transcriptional errors that result from this process are unintentional.

## 2016-12-08 NOTE — Patient Instructions (Signed)
1.Continue with Prempro daily 2. Recommend healthy eating and exercise with close observation of weight 3. Return next year as scheduled for annual exam

## 2017-02-14 ENCOUNTER — Ambulatory Visit: Payer: Self-pay

## 2017-02-14 NOTE — Telephone Encounter (Signed)
Pt requesting to be seen in office today. No appoinments at [facility] or [facility]. Offered to look at [facility] locations. Pt stated she would rather go to local Urgent Care Center. Reason for Disposition . SEVERE (e.g., excruciating) throat pain  Answer Assessment - Initial Assessment Questions 1. ONSET: "When did the throat start hurting?" (Hours or days ago)      Monday 2. SEVERITY: "How bad is the sore throat?" (Scale 1-10; mild, moderate or severe)   - MILD (1-3):  doesn't interfere with eating or normal activities   - MODERATE (4-7): interferes with eating some solids and normal activities   - SEVERE (8-10):  excruciating pain, interferes with most normal activities   - SEVERE DYSPHAGIA: can't swallow liquids, drooling     9/10 3. STREP EXPOSURE: "Has there been any exposure to strep within the past week?" If so, ask: "What type of contact occurred?"      No not that she is aware of  4.  VIRAL SYMPTOMS: "Are there any symptoms of a cold, such as a runny nose, cough, hoarse voice or red eyes?"      Hoarse voice, runny nose 5. FEVER: "Do you have a fever?" If so, ask: "What is your temperature, how was it measured, and when did it start?"     States "i believe I have a low grade fever" 6. PUS ON THE TONSILS: "Is there pus on the tonsils in the back of your throat?"     Unsure  7. OTHER SYMPTOMS: "Do you have any other symptoms?" (e.g., difficulty breathing, headache, rash)     no 8. PREGNANCY: "Is there any chance you are pregnant?" "When was your last menstrual period?"     n/a  Protocols used: SORE THROAT-A-AH

## 2017-02-15 NOTE — Telephone Encounter (Signed)
Noted  

## 2017-02-20 ENCOUNTER — Encounter: Payer: Self-pay | Admitting: Family Medicine

## 2017-02-20 ENCOUNTER — Ambulatory Visit: Payer: 59 | Admitting: Family Medicine

## 2017-02-20 VITALS — BP 132/78 | HR 72 | Temp 98.1°F | Wt 161.8 lb

## 2017-02-20 DIAGNOSIS — J069 Acute upper respiratory infection, unspecified: Secondary | ICD-10-CM

## 2017-02-20 DIAGNOSIS — B9789 Other viral agents as the cause of diseases classified elsewhere: Secondary | ICD-10-CM | POA: Diagnosis not present

## 2017-02-20 MED ORDER — AZITHROMYCIN 250 MG PO TABS
ORAL_TABLET | ORAL | 0 refills | Status: DC
Start: 2017-02-20 — End: 2017-07-12

## 2017-02-20 MED ORDER — HYDROCODONE-HOMATROPINE 5-1.5 MG/5ML PO SYRP
5.0000 mL | ORAL_SOLUTION | Freq: Every evening | ORAL | 0 refills | Status: DC | PRN
Start: 2017-02-20 — End: 2017-07-12

## 2017-02-20 NOTE — Patient Instructions (Signed)
For nasal congestion you can use Afrin nasal spray for 3 days max, Sudafed, saline nasal spray (generic is fine for all). For cough you can try Delsym. Drink enough fluids to make your urine light yellow. For fever/chill/muscle aches you can take over the counter acetaminophen or ibuprofen.  Please come back in if you are not better in 5-7 days or if you develop wheezing, shortness of breath or persistent vomiting.  If not better by end of week, start antibiotic

## 2017-02-20 NOTE — Progress Notes (Signed)
Subjective:    Patient ID: Christine Schroeder, female    DOB: 02/22/1966, 51 y.o.   MRN: [id number]  HPI This is a 51 yo female who presents today with sore throat x 1 week, went to [facility], strep was negative, was told it was viral illness. Was given topical treatment with some improvement. Now has nasal and chest congestion. Last couple of morning with severe congestion, a lot of nasal congestion and yellow, thick sputum in the morning. Taking Nyquil and Zyrtec D- little relief. Throat is dry.  No fever in the last 48 hours.  No ear pain.  Past Medical History:  Diagnosis Date  . Allergic rhinitis   . Chickenpox   . Family history of adverse reaction to anesthesia    Mother - PONV  . GERD (gastroesophageal reflux disease)   . Increased BMI    Past Surgical History:  Procedure Laterality Date  . NO PAST SURGERIES     Family History  Problem Relation Age of Onset  . Hypertension Mother   . Heart disease Maternal Uncle   . Stroke Maternal Grandmother   . Diabetes Maternal Grandmother   . Diabetes Maternal Grandfather   . Colon cancer Maternal Aunt   . Cancer Neg Hx    Social History   Tobacco Use  . Smoking status: Never Smoker  . Smokeless tobacco: Never Used  Substance Use Topics  . Alcohol use: Yes    Alcohol/week: 0.0 oz    Comment: Holidays  . Drug use: No      Review of Systems Per HPI    Objective:   Physical Exam  Constitutional: She is oriented to person, place, and time. She appears well-developed and well-nourished. No distress.  HENT:  Head: Normocephalic and atraumatic.  Right Ear: Tympanic membrane, external ear and ear canal normal.  Left Ear: Tympanic membrane, external ear and ear canal normal.  Nose: Mucosal edema and rhinorrhea present.  Mouth/Throat: Mucous membranes are normal. Posterior oropharyngeal erythema present. No oropharyngeal exudate or posterior oropharyngeal edema.  Eyes: Conjunctivae are normal.  Neck: Normal range of motion.  Neck supple.  Cardiovascular: Normal rate, regular rhythm and normal heart sounds.  Pulmonary/Chest: Effort normal and breath sounds normal.  Lymphadenopathy:    She has no cervical adenopathy.  Neurological: She is alert and oriented to person, place, and time.  Skin: Skin is warm and dry. She is not diaphoretic.  Psychiatric: She has a normal mood and affect. Her behavior is normal. Judgment and thought content normal.  Vitals reviewed.     BP 132/78 (BP Location: Right Arm, Patient Position: Sitting, Cuff Size: Normal)   Pulse 72   Temp 98.1 F (36.7 C) (Oral)   Wt 161 lb 12 oz (73.4 kg)   SpO2 97%   BMI 28.65 kg/m  Wt Readings from Last 3 Encounters:  02/20/17 161 lb 12 oz (73.4 kg)  12/08/16 155 lb 3.2 oz (70.4 kg)  09/14/16 151 lb 9.6 oz (68.8 kg)       Assessment & Plan:  1. Viral URI with cough - suspect viral illness, provided wait and see antibiotic if not better in 3-5 days and suggested change in symptomatic treatment. Stop antihistamine for a couple of days, may be over drying. -  Patient Instructions  For nasal congestion you can use Afrin nasal spray for 3 days max, Sudafed, saline nasal spray (generic is fine for all). For cough you can try Delsym. Drink enough fluids to make your urine  light yellow. For fever/chill/muscle aches you can take over the counter acetaminophen or ibuprofen.  Please come back in if you are not better in 5-7 days or if you develop wheezing, shortness of breath or persistent vomiting.  If not better by end of week, start antibiotic   - azithromycin (ZITHROMAX) 250 MG tablet; Take 2 tabs PO x 1 dose, then 1 tab PO QD x 4 days  Dispense: 6 tablet; Refill: 0   Clarene Reamer, FNP-BC  [facility] [employer] [facility]  02/21/2017 2:35 PM

## 2017-02-21 ENCOUNTER — Encounter: Payer: Self-pay | Admitting: Family Medicine

## 2017-07-06 NOTE — Progress Notes (Signed)
Patient ID: Christine Schroeder, female   DOB: 1965/11/22, 52 y.o.   MRN: [id number] ANNUAL PREVENTATIVE CARE GYN  ENCOUNTER NOTE  Subjective:       Christine Schroeder is a 52 y.o. G0P0000 female here for a routine annual gynecologic exam.  Current complaints: 1.  Unsure if need to continue with hrt  Patient is exercising 6 days a week. Prediabetes is improving with activity. Bowel and bladder function are normal. Patient is on Prempro without symptoms.  She is taking vitamin D but not calcium. Bowel and bladder function are normal.   Gynecologic History lmp- post menopausal  Contraception:post menopausal Last Pap: 06/2014 neg/neg. Results were: normal Last mammogram: 2018 birad 1. Results were: normal  Obstetric History OB History  Gravida Para Term Preterm AB Living  0 0 0 0 0 0  SAB TAB Ectopic Multiple Live Births  0 0 0 0      Past Medical History:  Diagnosis Date  . Allergic rhinitis   . Chickenpox   . Family history of adverse reaction to anesthesia    Mother - PONV  . GERD (gastroesophageal reflux disease)   . Increased BMI     Past Surgical History:  Procedure Laterality Date  . NO PAST SURGERIES    . SHOULDER ARTHROSCOPY Right 02/10/2016   Procedure: Limited arthroscopic debridement, arthroscopic SLAP tear, subacromial decompression, mini-open rotator cuff repair, and mini-open biceps tenodesis, right shoulder;  Surgeon: Corky Mull, MD;  Location: [facility];  Service: Orthopedics;  Laterality: Right;    Current Outpatient Medications on File Prior to Visit  Medication Sig Dispense Refill  . azithromycin (ZITHROMAX) 250 MG tablet Take 2 tabs PO x 1 dose, then 1 tab PO QD x 4 days 6 tablet 0  . cetirizine (ZYRTEC) 10 MG tablet Take 10 mg by mouth daily.     . Cholecalciferol (VITAMIN D PO) Take by mouth daily.    . diphenhydrAMINE (BENADRYL) 25 mg capsule Take 25 mg by mouth daily.     Marland Kitchen esomeprazole (NEXIUM) 20 MG capsule Take 20 mg by mouth daily at 12  noon.    . estrogen, conjugated,-medroxyprogesterone (PREMPRO) 0.625-2.5 MG tablet Take 1 tablet by mouth daily. 90 tablet 2  . HYDROcodone-homatropine (HYCODAN) 5-1.5 MG/5ML syrup Take 5 mLs at bedtime as needed by mouth for cough. 60 mL 0  . LYSINE PO Take by mouth daily.    Marland Kitchen MAGNESIUM PO Take by mouth daily.    . Multiple Vitamin (MULTIVITAMIN) tablet Take 1 tablet by mouth daily.    . Pyridoxine HCl (VITAMIN B6 PO) Take by mouth daily.    . vitamin B-12 (CYANOCOBALAMIN) 1000 MCG tablet Take 1,000 mcg by mouth daily.     No current facility-administered medications on file prior to visit.     Allergies  Allergen Reactions  . Etodolac Other (See Comments)    Abdominal pain  . Meloxicam Other (See Comments)    Abdominal pain  . Penicillins Hives  . Prednisone Rash    Abdominal pain    Social History   Socioeconomic History  . Marital status: Married    Spouse name: Not on file  . Number of children: Not on file  . Years of education: Not on file  . Highest education level: Not on file  Occupational History  . Not on file  Social Needs  . Financial resource strain: Not on file  . Food insecurity:    Worry: Not on file    Inability:  Not on file  . Transportation needs:    Medical: Not on file    Non-medical: Not on file  Tobacco Use  . Smoking status: Never Smoker  . Smokeless tobacco: Never Used  Substance and Sexual Activity  . Alcohol use: Yes    Alcohol/week: 0.0 oz    Comment: Holidays  . Drug use: No  . Sexual activity: Yes    Birth control/protection: Post-menopausal  Lifestyle  . Physical activity:    Days per week: Not on file    Minutes per session: Not on file  . Stress: Not on file  Relationships  . Social connections:    Talks on phone: Not on file    Gets together: Not on file    Attends religious service: Not on file    Active member of club or organization: Not on file    Attends meetings of clubs or organizations: Not on file     Relationship status: Not on file  . Intimate partner violence:    Fear of current or ex partner: Not on file    Emotionally abused: Not on file    Physically abused: Not on file    Forced sexual activity: Not on file  Other Topics Concern  . Not on file  Social History Narrative   Married.   No children.   Works for [employer] in [employer].   Enjoys hiking, spending time outdoors, reading, golfing.     Family History  Problem Relation Age of Onset  . Hypertension Mother   . Heart disease Maternal Uncle   . Stroke Maternal Grandmother   . Diabetes Maternal Grandmother   . Diabetes Maternal Grandfather   . Colon cancer Maternal Aunt   . Cancer Neg Hx     The following portions of the patient's history were reviewed and updated as appropriate: allergies, current medications, past family history, past medical history, past social history, past surgical history and problem list.  Review of Systems Review of Systems  Constitutional: Negative.   HENT: Negative.   Eyes: Negative.   Respiratory: Negative.   Cardiovascular: Negative.   Gastrointestinal: Negative.   Genitourinary: Negative.   Musculoskeletal: Negative.   Skin: Negative.      Objective:   BP 127/73   Pulse 67   Ht 5\' 3"  (1.6 m)   Wt 154 lb 1.6 oz (69.9 kg)   LMP 07/12/2016   BMI 27.30 kg/m  CONSTITUTIONAL: Well-developed, well-nourished female in no acute distress.  PSYCHIATRIC: Normal mood and affect. Normal behavior. Normal judgment and thought content. [place]ubert: Alert and oriented to person, place, and time. Normal muscle tone coordination. No cranial nerve deficit noted. HENT:  Normocephalic, atraumatic, External right and left ear normal.  EYES: Conjunctivae and EOM are normal No scleral icterus.  NECK: Normal range of motion, supple, no masses.  Normal thyroid.  SKIN: Skin is warm and dry. No rash noted. Not diaphoretic. No erythema. No pallor. CARDIOVASCULAR: Normal heart rate noted,  regular rhythm, no murmur. RESPIRATORY: Clear to auscultation bilaterally. Effort and breath sounds normal, no problems with respiration noted. BREASTS: Symmetric in size. No masses, skin changes, nipple drainage, or lymphadenopathy. ABDOMEN: Soft, normal bowel sounds, no distention noted.  No tenderness, rebound or guarding.  BLADDER: Normal PELVIC:  External Genitalia: Normal  BUS: Normal  Vagina: Normal; no significant discharge  Cervix: Normal; no lesions; no cervical motion tenderness  Uterus: Normal; Midplane, normal size and shape, mobile, nontender  Adnexa: Normal  RV: External  Exam NormaI, No Rectal Masses and Normal Sphincter tone  MUSCULOSKELETAL: Normal range of motion. No tenderness.  No cyanosis, clubbing, or edema.  2+ distal pulses. LYMPHATIC: No Axillary, Supraclavicular, or Inguinal Adenopathy.    Assessment:   Annual gynecologic examination 51 y.o. Contraception: postmenopausal Normal BMI-27 Climacteric, asymptomatic on HRT   Plan:  Pap: pap/hpv Mammogram: Ordered Stool Guaiac Testing: will order screening colonoscopy Labs: thru pcp Routine preventative health maintenance measures emphasized: Exercise/Diet/Weight control, Alcohol/Substance use risks, Stress Management and Safe Sex  Return to [place], CMA  Brayton Mars, MD  Note: This dictation was prepared with Dragon dictation along with smaller phrase technology. Any transcriptional errors that result from this process are unintentional.

## 2017-07-08 ENCOUNTER — Other Ambulatory Visit: Payer: Self-pay | Admitting: Obstetrics and Gynecology

## 2017-07-12 ENCOUNTER — Encounter: Payer: 59 | Admitting: Obstetrics and Gynecology

## 2017-07-12 ENCOUNTER — Ambulatory Visit (INDEPENDENT_AMBULATORY_CARE_PROVIDER_SITE_OTHER): Payer: Managed Care, Other (non HMO) | Admitting: Obstetrics and Gynecology

## 2017-07-12 ENCOUNTER — Encounter: Payer: Self-pay | Admitting: Obstetrics and Gynecology

## 2017-07-12 VITALS — BP 127/73 | HR 67 | Ht 63.0 in | Wt 154.1 lb

## 2017-07-12 DIAGNOSIS — N951 Menopausal and female climacteric states: Secondary | ICD-10-CM | POA: Diagnosis not present

## 2017-07-12 DIAGNOSIS — Z01419 Encounter for gynecological examination (general) (routine) without abnormal findings: Secondary | ICD-10-CM | POA: Diagnosis not present

## 2017-07-12 DIAGNOSIS — Z1239 Encounter for other screening for malignant neoplasm of breast: Secondary | ICD-10-CM

## 2017-07-12 DIAGNOSIS — Z1211 Encounter for screening for malignant neoplasm of colon: Secondary | ICD-10-CM | POA: Diagnosis not present

## 2017-07-12 DIAGNOSIS — Z1231 Encounter for screening mammogram for malignant neoplasm of breast: Secondary | ICD-10-CM | POA: Diagnosis not present

## 2017-07-12 MED ORDER — CONJ ESTROG-MEDROXYPROGEST ACE 0.625-2.5 MG PO TABS: 1.0000 | ORAL_TABLET | Freq: Every day | ORAL | 3 refills | Status: DC

## 2017-07-12 NOTE — Patient Instructions (Addendum)
1.  Pap/HPV is performed 2.  Mammogram is ordered 3.  Screening colonoscopy referral is made 4.  Screening labs are through primary care 5.  Continue with healthy eating and exercise 6.  Recommend calcium 1200 mg a day and vitamin D 800 international units a day for osteoporosis prevention 7.  Continue with Prempro 0.625/2.5 mg daily.  Consider taking HRT until age 51-5 years post average age of menopause 20.  Return in 1 year for annual exam   Health Maintenance for Postmenopausal Women Menopause is a normal process in which your reproductive ability comes to an end. This process happens gradually over a span of months to years, usually between the ages of 9 and 8. Menopause is complete when you have missed 12 consecutive menstrual periods. It is important to talk with your health care provider about some of the most common conditions that affect postmenopausal women, such as heart disease, cancer, and bone loss (osteoporosis). Adopting a healthy lifestyle and getting preventive care can help to promote your health and wellness. Those actions can also lower your chances of developing some of these common conditions. What should I know about menopause? During menopause, you may experience a number of symptoms, such as:  Moderate-to-severe hot flashes.  Night sweats.  Decrease in sex drive.  Mood swings.  Headaches.  Tiredness.  Irritability.  Memory problems.  Insomnia.  Choosing to treat or not to treat menopausal changes is an individual decision that you make with your health care provider. What should I know about hormone replacement therapy and supplements? Hormone therapy products are effective for treating symptoms that are associated with menopause, such as hot flashes and night sweats. Hormone replacement carries certain risks, especially as you become older. If you are thinking about using estrogen or estrogen with progestin treatments, discuss the benefits and risks  with your health care provider. What should I know about heart disease and stroke? Heart disease, heart attack, and stroke become more likely as you age. This may be due, in part, to the hormonal changes that your body experiences during menopause. These can affect how your body processes dietary fats, triglycerides, and cholesterol. Heart attack and stroke are both medical emergencies. There are many things that you can do to help prevent heart disease and stroke:  Have your blood pressure checked at least every 1-2 years. High blood pressure causes heart disease and increases the risk of stroke.  If you are 57-23 years old, ask your health care provider if you should take aspirin to prevent a heart attack or a stroke.  Do not use any tobacco products, including cigarettes, chewing tobacco, or electronic cigarettes. If you need help quitting, ask your health care provider.  It is important to eat a healthy diet and maintain a healthy weight. ? Be sure to include plenty of vegetables, fruits, low-fat dairy products, and lean protein. ? Avoid eating foods that are high in solid fats, added sugars, or salt (sodium).  Get regular exercise. This is one of the most important things that you can do for your health. ? Try to exercise for at least 150 minutes each week. The type of exercise that you do should increase your heart rate and make you sweat. This is known as moderate-intensity exercise. ? Try to do strengthening exercises at least twice each week. Do these in addition to the moderate-intensity exercise.  Know your numbers.Ask your health care provider to check your cholesterol and your blood glucose. Continue to have your  blood tested as directed by your health care provider.  What should I know about cancer screening? There are several types of cancer. Take the following steps to reduce your risk and to catch any cancer development as early as possible. Breast Cancer  Practice breast  self-awareness. ? This means understanding how your breasts normally appear and feel. ? It also means doing regular breast self-exams. Let your health care provider know about any changes, no matter how small.  If you are 109 or older, have a clinician do a breast exam (clinical breast exam or CBE) every year. Depending on your age, family history, and medical history, it may be recommended that you also have a yearly breast X-ray (mammogram).  If you have a family history of breast cancer, talk with your health care provider about genetic screening.  If you are at high risk for breast cancer, talk with your health care provider about having an MRI and a mammogram every year.  Breast cancer (BRCA) gene test is recommended for women who have family members with BRCA-related cancers. Results of the assessment will determine the need for genetic counseling and BRCA1 and for BRCA2 testing. BRCA-related cancers include these types: ? Breast. This occurs in males or females. ? Ovarian. ? Tubal. This may also be called fallopian tube cancer. ? Cancer of the abdominal or pelvic lining (peritoneal cancer). ? Prostate. ? Pancreatic.  Cervical, Uterine, and Ovarian Cancer Your health care provider may recommend that you be screened regularly for cancer of the pelvic organs. These include your ovaries, uterus, and vagina. This screening involves a pelvic exam, which includes checking for microscopic changes to the surface of your cervix (Pap test).  For women ages 21-65, health care providers may recommend a pelvic exam and a Pap test every three years. For women ages 16-65, they may recommend the Pap test and pelvic exam, combined with testing for human papilloma virus (HPV), every five years. Some types of HPV increase your risk of cervical cancer. Testing for HPV may also be done on women of any age who have unclear Pap test results.  Other health care providers may not recommend any screening for  nonpregnant women who are considered low risk for pelvic cancer and have no symptoms. Ask your health care provider if a screening pelvic exam is right for you.  If you have had past treatment for cervical cancer or a condition that could lead to cancer, you need Pap tests and screening for cancer for at least 20 years after your treatment. If Pap tests have been discontinued for you, your risk factors (such as having a new sexual partner) need to be reassessed to determine if you should start having screenings again. Some women have medical problems that increase the chance of getting cervical cancer. In these cases, your health care provider may recommend that you have screening and Pap tests more often.  If you have a family history of uterine cancer or ovarian cancer, talk with your health care provider about genetic screening.  If you have vaginal bleeding after reaching menopause, tell your health care provider.  There are currently no reliable tests available to screen for ovarian cancer.  Lung Cancer Lung cancer screening is recommended for adults 46-28 years old who are at high risk for lung cancer because of a history of smoking. A yearly low-dose CT scan of the lungs is recommended if you:  Currently smoke.  Have a history of at least 30 pack-years of smoking  and you currently smoke or have quit within the past 15 years. A pack-year is smoking an average of one pack of cigarettes per day for one year.  Yearly screening should:  Continue until it has been 15 years since you quit.  Stop if you develop a health problem that would prevent you from having lung cancer treatment.  Colorectal Cancer  This type of cancer can be detected and can often be prevented.  Routine colorectal cancer screening usually begins at age 1 and continues through age 57.  If you have risk factors for colon cancer, your health care provider may recommend that you be screened at an earlier age.  If you  have a family history of colorectal cancer, talk with your health care provider about genetic screening.  Your health care provider may also recommend using home test kits to check for hidden blood in your stool.  A small camera at the end of a tube can be used to examine your colon directly (sigmoidoscopy or colonoscopy). This is done to check for the earliest forms of colorectal cancer.  Direct examination of the colon should be repeated every 5-10 years until age 73. However, if early forms of precancerous polyps or small growths are found or if you have a family history or genetic risk for colorectal cancer, you may need to be screened more often.  Skin Cancer  Check your skin from head to toe regularly.  Monitor any moles. Be sure to tell your health care provider: ? About any new moles or changes in moles, especially if there is a change in a mole's shape or color. ? If you have a mole that is larger than the size of a pencil eraser.  If any of your family members has a history of skin cancer, especially at a young age, talk with your health care provider about genetic screening.  Always use sunscreen. Apply sunscreen liberally and repeatedly throughout the day.  Whenever you are outside, protect yourself by wearing long sleeves, pants, a wide-brimmed hat, and sunglasses.  What should I know about osteoporosis? Osteoporosis is a condition in which bone destruction happens more quickly than new bone creation. After menopause, you may be at an increased risk for osteoporosis. To help prevent osteoporosis or the bone fractures that can happen because of osteoporosis, the following is recommended:  If you are 20-29 years old, get at least 1,000 mg of calcium and at least 600 mg of vitamin D per day.  If you are older than age 47 but younger than age 40, get at least 1,200 mg of calcium and at least 600 mg of vitamin D per day.  If you are older than age 27, get at least 1,200 mg of  calcium and at least 800 mg of vitamin D per day.  Smoking and excessive alcohol intake increase the risk of osteoporosis. Eat foods that are rich in calcium and vitamin D, and do weight-bearing exercises several times each week as directed by your health care provider. What should I know about how menopause affects my mental health? Depression may occur at any age, but it is more common as you become older. Common symptoms of depression include:  Low or sad mood.  Changes in sleep patterns.  Changes in appetite or eating patterns.  Feeling an overall lack of motivation or enjoyment of activities that you previously enjoyed.  Frequent crying spells.  Talk with your health care provider if you think that you are experiencing  depression. What should I know about immunizations? It is important that you get and maintain your immunizations. These include:  Tetanus, diphtheria, and pertussis (Tdap) booster vaccine.  Influenza every year before the flu season begins.  Pneumonia vaccine.  Shingles vaccine.  Your health care provider may also recommend other immunizations. This information is not intended to replace advice given to you by your health care provider. Make sure you discuss any questions you have with your health care provider. Document Released: 05/20/2005 Document Revised: 10/16/2015 Document Reviewed: 12/30/2014 Elsevier Interactive Patient Education  2018 [employer].

## 2017-07-17 LAB — IGP, COBASHPV16/18
HPV 16: NEGATIVE
HPV 18: NEGATIVE
HPV other hr types: NEGATIVE
PAP Smear Comment: 0

## 2017-07-18 LAB — LIPID PANEL
Chol/HDL Ratio: 3.1 ratio (ref 0.0–4.4)
Cholesterol, Total: 148 mg/dL (ref 100–199)
HDL: 47 mg/dL (ref >39)
LDL Calculated: 85 mg/dL (ref 0–99)
Triglycerides: 80 mg/dL (ref 0–149)
VLDL Cholesterol Cal: 16 mg/dL (ref 5–40)

## 2017-07-18 LAB — HEMOGLOBIN A1C
Est. average glucose Bld gHb Est-mCnc: 114 mg/dL
Hgb A1c MFr Bld: 5.6 % (ref 4.8–5.6)

## 2017-07-18 LAB — TSH: TSH: 1.79 u[IU]/mL (ref 0.450–4.500)

## 2017-07-18 LAB — GLUCOSE, RANDOM: Glucose: 92 mg/dL (ref 65–99)

## 2017-07-19 DIAGNOSIS — H524 Presbyopia: Secondary | ICD-10-CM | POA: Insufficient documentation

## 2017-07-19 DIAGNOSIS — H5213 Myopia, bilateral: Secondary | ICD-10-CM | POA: Insufficient documentation

## 2017-07-24 ENCOUNTER — Telehealth: Payer: Self-pay

## 2017-07-24 ENCOUNTER — Other Ambulatory Visit: Payer: Self-pay

## 2017-07-24 DIAGNOSIS — Z1211 Encounter for screening for malignant neoplasm of colon: Secondary | ICD-10-CM

## 2017-07-24 NOTE — Telephone Encounter (Signed)
Gastroenterology Pre-Procedure Review  Request Date: 10/17/17 Requesting Physician: Dr. Allen Norris  PATIENT REVIEW QUESTIONS: The patient responded to the following health history questions as indicated:    1. Are you having any GI issues? no 2. Do you have a personal history of Polyps? no 3. Do you have a family history of Colon Cancer or Polyps? no 4. Diabetes Mellitus? no 5. Joint replacements in the past 12 months?no 6. Major health problems in the past 3 months?no 7. Any artificial heart valves, MVP, or defibrillator?no    MEDICATIONS & ALLERGIES:    Patient reports the following regarding taking any anticoagulation/antiplatelet therapy:   Plavix, Coumadin, Eliquis, Xarelto, Lovenox, Pradaxa, Brilinta, or Effient? no Aspirin? no  Patient confirms/reports the following medications:  Current Outpatient Medications  Medication Sig Dispense Refill  . cetirizine (ZYRTEC) 10 MG tablet Take 10 mg by mouth daily.     . Cholecalciferol (VITAMIN D PO) Take by mouth daily.    . diphenhydrAMINE (BENADRYL) 25 mg capsule Take 25 mg by mouth daily.     Marland Kitchen esomeprazole (NEXIUM) 20 MG capsule Take 20 mg by mouth daily at 12 noon.    . estrogen, conjugated,-medroxyprogesterone (PREMPRO) 0.625-2.5 MG tablet Take 1 tablet by mouth daily. 90 tablet 3  . LYSINE PO Take by mouth daily.    Marland Kitchen MAGNESIUM PO Take by mouth daily.    . Multiple Vitamin (MULTIVITAMIN) tablet Take 1 tablet by mouth daily.    . Pyridoxine HCl (VITAMIN B6 PO) Take by mouth daily.    . vitamin B-12 (CYANOCOBALAMIN) 1000 MCG tablet Take 1,000 mcg by mouth daily.     No current facility-administered medications for this visit.     Patient confirms/reports the following allergies:  Allergies  Allergen Reactions  . Etodolac Other (See Comments)    Abdominal pain  . Meloxicam Other (See Comments)    Abdominal pain  . Penicillins Hives  . Prednisone Rash    Abdominal pain    No orders of the defined types were placed in this  encounter.   AUTHORIZATION INFORMATION Primary Insurance: 1D#: Group #:  Secondary Insurance: 1D#: Group #:  SCHEDULE INFORMATION: Date: 10/17/17 Time: Location:[facility]

## 2017-09-13 ENCOUNTER — Telehealth: Payer: Self-pay

## 2017-09-13 NOTE — Telephone Encounter (Signed)
Patient called back to change colonoscopy date.  She has been moved to 11/03/17 with Dr. Vicente Males instead of 07/09 with Dr. Allen Norris.  Kieth Brightly has been informed and patient has been asked to change date on her instructions.

## 2017-09-13 NOTE — Telephone Encounter (Signed)
Received message from front desk that patient has called regarding colonoscopy and provided the date of 11/03/17.  She is already scheduled for a colonoscopy.  I am awaiting call back to see if this message is to reschedule.  I've left her a message on home and cell.  Thanks [employer]

## 2017-11-03 ENCOUNTER — Encounter
Admission: RE | Disposition: A | Discharge status: Home / Self Care | Payer: Self-pay | Source: Ambulatory Visit | Attending: Gastroenterology

## 2017-11-03 ENCOUNTER — Ambulatory Visit
Admission: RE | Admit: 2017-11-03 | Discharge: 2017-11-03 | Disposition: A | Discharge status: Home / Self Care | Payer: Managed Care, Other (non HMO) | Source: Ambulatory Visit | Attending: Gastroenterology | Admitting: Gastroenterology

## 2017-11-03 ENCOUNTER — Ambulatory Visit: Payer: Managed Care, Other (non HMO) | Admitting: Certified Registered Nurse Anesthetist

## 2017-11-03 ENCOUNTER — Encounter: Payer: Self-pay | Admitting: Anesthesiology

## 2017-11-03 DIAGNOSIS — K635 Polyp of colon: Secondary | ICD-10-CM | POA: Diagnosis not present

## 2017-11-03 DIAGNOSIS — Z79899 Other long term (current) drug therapy: Secondary | ICD-10-CM | POA: Diagnosis not present

## 2017-11-03 DIAGNOSIS — Z880 Allergy status to penicillin: Secondary | ICD-10-CM | POA: Diagnosis not present

## 2017-11-03 DIAGNOSIS — Z8249 Family history of ischemic heart disease and other diseases of the circulatory system: Secondary | ICD-10-CM | POA: Diagnosis not present

## 2017-11-03 DIAGNOSIS — D124 Benign neoplasm of descending colon: Secondary | ICD-10-CM | POA: Diagnosis not present

## 2017-11-03 DIAGNOSIS — K219 Gastro-esophageal reflux disease without esophagitis: Secondary | ICD-10-CM | POA: Insufficient documentation

## 2017-11-03 DIAGNOSIS — I739 Peripheral vascular disease, unspecified: Secondary | ICD-10-CM | POA: Insufficient documentation

## 2017-11-03 DIAGNOSIS — Z888 Allergy status to other drugs, medicaments and biological substances status: Secondary | ICD-10-CM | POA: Insufficient documentation

## 2017-11-03 DIAGNOSIS — Z1211 Encounter for screening for malignant neoplasm of colon: Secondary | ICD-10-CM | POA: Insufficient documentation

## 2017-11-03 HISTORY — PX: COLONOSCOPY WITH PROPOFOL: SHX5780

## 2017-11-03 LAB — POCT PREGNANCY, URINE: Preg Test, Ur: NEGATIVE

## 2017-11-03 SURGERY — COLONOSCOPY WITH PROPOFOL
Anesthesia: General

## 2017-11-03 MED ORDER — MIDAZOLAM HCL 2 MG/2ML IJ SOLN
INTRAMUSCULAR | Status: DC | PRN
  Administered 2017-11-03: 2 mg via INTRAVENOUS

## 2017-11-03 MED ORDER — PROPOFOL 10 MG/ML IV BOLUS
INTRAVENOUS | Status: DC | PRN
  Administered 2017-11-03: 70 mg via INTRAVENOUS
  Administered 2017-11-03: 20 mg via INTRAVENOUS

## 2017-11-03 MED ORDER — PROPOFOL 500 MG/50ML IV EMUL
INTRAVENOUS | Status: AC
  Filled 2017-11-03: qty 200

## 2017-11-03 MED ORDER — MIDAZOLAM HCL 2 MG/2ML IJ SOLN
INTRAMUSCULAR | Status: AC
  Filled 2017-11-03: qty 2

## 2017-11-03 MED ORDER — LIDOCAINE HCL (PF) 2 % IJ SOLN
INTRAMUSCULAR | Status: AC
  Filled 2017-11-03: qty 10

## 2017-11-03 MED ORDER — LIDOCAINE HCL (PF) 1 % IJ SOLN
INTRAMUSCULAR | Status: AC
  Filled 2017-11-03: qty 2

## 2017-11-03 MED ORDER — SODIUM CHLORIDE 0.9 % IV SOLN
INTRAVENOUS | Status: DC
  Administered 2017-11-03: 08:00:00 via INTRAVENOUS

## 2017-11-03 MED ORDER — PROPOFOL 500 MG/50ML IV EMUL
INTRAVENOUS | Status: DC | PRN
  Administered 2017-11-03: 160 ug/kg/min via INTRAVENOUS

## 2017-11-03 MED ORDER — LIDOCAINE HCL (PF) 1 % IJ SOLN: 2.0000 mL | Freq: Once | INTRAMUSCULAR | Status: DC

## 2017-11-03 NOTE — H&P (Signed)
Jonathon Bellows, MD [address], [place], [place], [place], [address] [address], [place], [place], [place], [address] Phone: [phone]  Fax: [phone]  Primary Care Physician:  Pleas Koch, NP   Pre-Procedure History & Physical: HPI:  Christine Schroeder is a 52 y.o. female is here for an colonoscopy.   Past Medical History:  Diagnosis Date  . Allergic rhinitis   . Chickenpox   . Family history of adverse reaction to anesthesia    Mother - PONV  . GERD (gastroesophageal reflux disease)   . Increased BMI     Past Surgical History:  Procedure Laterality Date  . NO PAST SURGERIES    . SHOULDER ARTHROSCOPY Right 02/10/2016   Procedure: Limited arthroscopic debridement, arthroscopic SLAP tear, subacromial decompression, mini-open rotator cuff repair, and mini-open biceps tenodesis, right shoulder;  Surgeon: Corky Mull, MD;  Location: [facility];  Service: Orthopedics;  Laterality: Right;    Prior to Admission medications   Medication Sig Start Date End Date Taking? Authorizing Provider  cetirizine (ZYRTEC) 10 MG tablet Take 10 mg by mouth daily.     [provider]  Cholecalciferol (VITAMIN D PO) Take by mouth daily.    [provider]  diphenhydrAMINE (BENADRYL) 25 mg capsule Take 25 mg by mouth daily.     [provider]  esomeprazole (NEXIUM) 20 MG capsule Take 20 mg by mouth daily at 12 noon.    [provider]  estrogen, conjugated,-medroxyprogesterone (PREMPRO) 0.625-2.5 MG tablet Take 1 tablet by mouth daily. 07/12/17   Defrancesco, Alanda Slim, MD  LYSINE PO Take by mouth daily.    [provider]  MAGNESIUM PO Take by mouth daily.    [provider]  Multiple Vitamin (MULTIVITAMIN) tablet Take 1 tablet by mouth daily.    [provider]  Pyridoxine HCl (VITAMIN B6 PO) Take by mouth daily.    [provider]  vitamin B-12 (CYANOCOBALAMIN) 1000 MCG tablet Take 1,000 mcg by mouth  daily.    [provider]    Allergies as of 07/24/2017 - Review Complete 07/12/2017  Allergen Reaction Noted  . Etodolac Other (See Comments) 02/04/2015  . Meloxicam Other (See Comments) 02/04/2015  . Penicillins Hives 02/04/2015  . Prednisone Rash 02/04/2015    Family History  Problem Relation Age of Onset  . Hypertension Mother   . Heart disease Maternal Uncle   . Stroke Maternal Grandmother   . Diabetes Maternal Grandmother   . Diabetes Maternal Grandfather   . Colon cancer Maternal Aunt   . Cancer Neg Hx     Social History   Socioeconomic History  . Marital status: Married    Spouse name: Not on file  . Number of children: Not on file  . Years of education: Not on file  . Highest education level: Not on file  Occupational History  . Not on file  Social Needs  . Financial resource strain: Not on file  . Food insecurity:    Worry: Not on file    Inability: Not on file  . Transportation needs:    Medical: Not on file    Non-medical: Not on file  Tobacco Use  . Smoking status: Never Smoker  . Smokeless tobacco: Never Used  Substance and Sexual Activity  . Alcohol use: Yes    Alcohol/week: 0.0 oz    Comment: Holidays  . Drug use: No  . Sexual activity: Yes    Birth control/protection: Post-menopausal  Lifestyle  . Physical activity:    Days per week: 6 days    Minutes per session: 60 min  . Stress: Not on file  Relationships  . Social connections:    Talks on phone: Not on file    Gets together: Not on file    Attends religious service: Not on file    Active member of club or organization: Not on file    Attends meetings of clubs or organizations: Not on file    Relationship status: Not on file  . Intimate partner violence:    Fear of current or ex partner: Not on file    Emotionally abused: Not on file    Physically abused: Not on file    Forced sexual activity: Not on file  Other Topics Concern  . Not on file  Social History Narrative     Married.   No children.   Works for [employer] in [employer].   Enjoys hiking, spending time outdoors, reading, golfing.     Review of Systems: See HPI, otherwise negative ROS  Physical Exam: LMP 07/12/2016  General:   Alert,  pleasant and cooperative in NAD Head:  Normocephalic and atraumatic. Neck:  Supple; no masses or thyromegaly. Lungs:  Clear throughout to auscultation, normal respiratory effort.    Heart:  +S1, +S2, Regular rate and rhythm, No edema. Abdomen:  Soft, nontender and nondistended. Normal bowel sounds, without guarding, and without rebound.   Neurologic:  Alert and  oriented x4;  grossly normal neurologically.  Impression/Plan: Christine Schroeder is here for an colonoscopy to be performed for Screening colonoscopy average risk   Risks, benefits, limitations, and alternatives regarding  colonoscopy have been reviewed with the patient.  Questions have been answered.  All parties agreeable.   Jonathon Bellows, MD  11/03/2017, 7:37 AM

## 2017-11-03 NOTE — Op Note (Signed)
[facility] Patient Name: Christine Schroeder Procedure Date: 11/03/2017 7:27 AM MRN: [id number] Account #: [id number] Date of Birth: 1965-07-21 Admit Type: Outpatient Age: 52 Room: [facility] ENDO ROOM 4 Gender: Female Note Status: Finalized Procedure:            Colonoscopy Indications:          Screening for colorectal malignant neoplasm Providers:            Jonathon Bellows MD, MD Referring MD:         Pleas Koch (Referring MD) Medicines:            Monitored Anesthesia Care Complications:        No immediate complications. Procedure:            Pre-Anesthesia Assessment:                       - Prior to the procedure, a History and Physical was                        performed, and patient medications, allergies and                        sensitivities were reviewed. The patient's tolerance of                        previous anesthesia was reviewed.                       - The risks and benefits of the procedure and the                        sedation options and risks were discussed with the                        patient. All questions were answered and informed                        consent was obtained.                       - ASA Grade Assessment: II - A patient with mild                        systemic disease.                       After obtaining informed consent, the colonoscope was                        passed under direct vision. Throughout the procedure,                        the patient's blood pressure, pulse, and oxygen                        saturations were monitored continuously. The                        Colonoscope was introduced through the anus and  advanced to the the cecum, identified by the                        appendiceal orifice, IC valve and transillumination.                        The colonoscopy was performed with ease. The patient                        tolerated the procedure well. The  quality of the bowel                        preparation was good. Findings:      The perianal and digital rectal examinations were normal.      A 5 mm polyp was found in the proximal descending colon. The polyp was       sessile. The polyp was removed with a cold snare. Resection was       complete, but the polyp tissue was not retrieved.      The exam was otherwise without abnormality on direct and retroflexion       views. Impression:           - One 5 mm polyp in the proximal descending colon,                        removed with a cold snare. Complete resection. Polyp                        tissue not retrieved.                       - The examination was otherwise normal on direct and                        retroflexion views. Recommendation:       - Discharge patient to home (with escort).                       - Resume previous diet.                       - Continue present medications.                       - Await pathology results.                       - Repeat colonoscopy in 5 years for surveillance. Procedure Code(s):    --- Professional ---                       [phone], Colonoscopy, flexible; with removal of tumor(s),                        polyp(s), or other lesion(s) by snare technique Diagnosis Code(s):    --- Professional ---                       Z12.11, Encounter for screening for malignant neoplasm                        of colon  D12.4, Benign neoplasm of descending colon CPT copyright 2017 American Medical Association. All rights reserved. The codes documented in this report are preliminary and upon coder review may  be revised to meet current compliance requirements. Jonathon Bellows, MD Jonathon Bellows MD, MD 11/03/2017 8:17:28 AM This report has been signed electronically. Number of Addenda: 0 Note Initiated On: 11/03/2017 7:27 AM Scope Withdrawal Time: 0 hours 14 minutes 45 seconds  Total Procedure Duration: 0 hours 19 minutes 49 seconds        [facility]

## 2017-11-03 NOTE — Anesthesia Post-op Follow-up Note (Signed)
Anesthesia QCDR form completed.        

## 2017-11-03 NOTE — Anesthesia Preprocedure Evaluation (Signed)
Anesthesia Evaluation  Patient identified by MRN, date of birth, ID band Patient awake    Reviewed: Allergy & Precautions, NPO status , Patient's Chart, lab work & pertinent test results, reviewed documented beta blocker date and time   History of Anesthesia Complications (+) Family history of anesthesia reaction  Airway Mallampati: II  TM Distance: >3 FB     Dental  (+) Chipped   Pulmonary           Cardiovascular + Peripheral Vascular Disease       Neuro/Psych    GI/Hepatic GERD  ,  Endo/Other    Renal/GU      Musculoskeletal   Abdominal   Peds  Hematology   Anesthesia Other Findings   Reproductive/Obstetrics                             Anesthesia Physical Anesthesia Plan  ASA: II  Anesthesia Plan: General   Post-op Pain Management:    Induction: Intravenous  PONV Risk Score and Plan:   Airway Management Planned:   Additional Equipment:   Intra-op Plan:   Post-operative Plan:   Informed Consent: I have reviewed the patients History and Physical, chart, labs and discussed the procedure including the risks, benefits and alternatives for the proposed anesthesia with the patient or authorized representative who has indicated his/her understanding and acceptance.     Plan Discussed with: CRNA  Anesthesia Plan Comments:         Anesthesia Quick Evaluation

## 2017-11-03 NOTE — Anesthesia Procedure Notes (Signed)
Performed by: Latondra Gebhart, CRNA Pre-anesthesia Checklist: Patient identified, Emergency Drugs available, Suction available, Patient being monitored and Timeout performed Patient Re-evaluated:Patient Re-evaluated prior to induction Oxygen Delivery Method: Nasal cannula Induction Type: IV induction       

## 2017-11-03 NOTE — Transfer of Care (Signed)
Immediate Anesthesia Transfer of Care Note  Patient: Shalunda Lindh  Procedure(s) Performed: COLONOSCOPY WITH PROPOFOL (N/A )  Patient Location: PACU  Anesthesia Type:General  Level of Consciousness: sedated  Airway & Oxygen Therapy: Patient Spontanous Breathing and Patient connected to nasal cannula oxygen  Post-op Assessment: Report given to RN and Post -op Vital signs reviewed and stable  Post vital signs: Reviewed and stable  Last Vitals:  Vitals Value Taken Time  BP 96/54 11/03/2017  8:20 AM  Temp 36.6 C 11/03/2017  8:20 AM  Pulse 68 11/03/2017  8:21 AM  Resp 19 11/03/2017  8:21 AM  SpO2 98 % 11/03/2017  8:21 AM  Vitals shown include unvalidated device data.  Last Pain:  Vitals:   11/03/17 0820  TempSrc: Tympanic  PainSc:          Complications: No apparent anesthesia complications

## 2017-11-03 NOTE — Anesthesia Postprocedure Evaluation (Signed)
Anesthesia Post Note  Patient: Christine Schroeder  Procedure(s) Performed: COLONOSCOPY WITH PROPOFOL (N/A )  Patient location during evaluation: Endoscopy Anesthesia Type: General Level of consciousness: awake and alert Pain management: pain level controlled Vital Signs Assessment: post-procedure vital signs reviewed and stable Respiratory status: spontaneous breathing, nonlabored ventilation, respiratory function stable and patient connected to nasal cannula oxygen Cardiovascular status: blood pressure returned to baseline and stable Postop Assessment: no apparent nausea or vomiting Anesthetic complications: no     Last Vitals:  Vitals:   11/03/17 0830 11/03/17 0840  BP: 106/64 117/68  Pulse: 66 68  Resp: 18 16  Temp:    SpO2: 100% 100%    Last Pain:  Vitals:   11/03/17 0840  TempSrc:   PainSc: 0-No pain                 Madelena Maturin S

## 2017-11-06 ENCOUNTER — Encounter: Payer: Self-pay | Admitting: Gastroenterology

## 2018-02-22 ENCOUNTER — Ambulatory Visit: Payer: Managed Care, Other (non HMO) | Admitting: Family Medicine

## 2018-02-22 ENCOUNTER — Encounter: Payer: Self-pay | Admitting: Family Medicine

## 2018-02-22 VITALS — BP 124/68 | HR 59 | Temp 97.8°F | Ht 63.0 in | Wt 157.8 lb

## 2018-02-22 DIAGNOSIS — J069 Acute upper respiratory infection, unspecified: Secondary | ICD-10-CM | POA: Diagnosis not present

## 2018-02-22 DIAGNOSIS — H8110 Benign paroxysmal vertigo, unspecified ear: Secondary | ICD-10-CM | POA: Insufficient documentation

## 2018-02-22 DIAGNOSIS — H8113 Benign paroxysmal vertigo, bilateral: Secondary | ICD-10-CM

## 2018-02-22 MED ORDER — MECLIZINE HCL 25 MG PO TABS: 25.0000 mg | ORAL_TABLET | Freq: Three times a day (TID) | ORAL | 1 refills | Status: DC | PRN

## 2018-02-22 NOTE — Progress Notes (Signed)
Subjective:    Patient ID: Christine Schroeder, female    DOB: 1966/03/11, 52 y.o.   MRN: [id number]  HPI 52 yo pt of NP [facility] here for ear pressure/ light headedness and sinus symptoms   Symptoms started Tuesday Woke up and felt immediately dizzy (off and on since then)  Worse if she turns or changes directions quickly (turning over in bed)  Feels like spinning  Also nausea   Drainage/sinus -pnd today  No fever  No ST  No facial pain/perhaps pressure  Nasal congestion started this am  A little hoarse  No ear pain  No cough    BP Readings from Last 3 Encounters:  02/22/18 124/68  11/03/17 117/68  07/12/17 127/73    Patient Active Problem List   Diagnosis Date Noted  . BPV (benign positional vertigo) 02/22/2018  . Viral URI 02/22/2018  . Vasomotor symptoms due to menopause 09/14/2016  . Preventative health care 03/27/2015  . Borderline diabetes 03/27/2015  . Palpitations 02/04/2015  . Allergic rhinitis 02/04/2015  . GERD (gastroesophageal reflux disease) 02/04/2015   Past Medical History:  Diagnosis Date  . Allergic rhinitis   . Chickenpox   . Family history of adverse reaction to anesthesia    Mother - PONV  . GERD (gastroesophageal reflux disease)   . Increased BMI    Past Surgical History:  Procedure Laterality Date  . COLONOSCOPY WITH PROPOFOL N/A 11/03/2017   Procedure: COLONOSCOPY WITH PROPOFOL;  Surgeon: Jonathon Bellows, MD;  Location: [facility] ENDOSCOPY;  Service: Endoscopy;  Laterality: N/A;  . NO PAST SURGERIES    . SHOULDER ARTHROSCOPY Right 02/10/2016   Procedure: Limited arthroscopic debridement, arthroscopic SLAP tear, subacromial decompression, mini-open rotator cuff repair, and mini-open biceps tenodesis, right shoulder;  Surgeon: Corky Mull, MD;  Location: [facility];  Service: Orthopedics;  Laterality: Right;   Social History   Tobacco Use  . Smoking status: Never Smoker  . Smokeless tobacco: Never Used  Substance Use Topics  . Alcohol  use: Yes    Alcohol/week: 0.0 standard drinks    Comment: Holidays  . Drug use: No   Family History  Problem Relation Age of Onset  . Hypertension Mother   . Heart disease Maternal Uncle   . Stroke Maternal Grandmother   . Diabetes Maternal Grandmother   . Diabetes Maternal Grandfather   . Colon cancer Maternal Aunt   . Cancer Neg Hx    Allergies  Allergen Reactions  . Etodolac Other (See Comments)    Abdominal pain  . Meloxicam Other (See Comments)    Abdominal pain  . Penicillins Hives  . Prednisone Rash    Abdominal pain   Current Outpatient Medications on File Prior to Visit  Medication Sig Dispense Refill  . cetirizine (ZYRTEC) 10 MG tablet Take 10 mg by mouth daily.     . Cholecalciferol (VITAMIN D PO) Take by mouth daily.    . diphenhydrAMINE (BENADRYL) 25 mg capsule Take 25 mg by mouth daily.     Marland Kitchen esomeprazole (NEXIUM) 20 MG capsule Take 20 mg by mouth daily at 12 noon.    . estrogen, conjugated,-medroxyprogesterone (PREMPRO) 0.625-2.5 MG tablet Take 1 tablet by mouth daily. 90 tablet 3  . LYSINE PO Take by mouth daily.    Marland Kitchen MAGNESIUM PO Take by mouth daily.    . Multiple Vitamin (MULTIVITAMIN) tablet Take 1 tablet by mouth daily.    . Pyridoxine HCl (VITAMIN B6 PO) Take by mouth daily.    Marland Kitchen  vitamin B-12 (CYANOCOBALAMIN) 1000 MCG tablet Take 1,000 mcg by mouth daily.     No current facility-administered medications on file prior to visit.      Review of Systems  Constitutional: Positive for appetite change and fatigue. Negative for fever.  HENT: Positive for congestion, postnasal drip, rhinorrhea, sinus pressure, sneezing and sore throat. Negative for ear discharge, ear pain and facial swelling.   Eyes: Negative for pain and discharge.  Respiratory: Positive for cough. Negative for shortness of breath, wheezing and stridor.   Cardiovascular: Negative for chest pain.  Gastrointestinal: Positive for nausea. Negative for diarrhea.  Genitourinary: Negative for  frequency, hematuria and urgency.  Musculoskeletal: Negative for arthralgias and myalgias.  Skin: Negative for rash.  Neurological: Positive for dizziness and headaches. Negative for tremors, seizures, syncope, facial asymmetry, speech difficulty, weakness, light-headedness and numbness.  Psychiatric/Behavioral: Negative for confusion and dysphoric mood.       Objective:   Physical Exam  Constitutional: She appears well-developed and well-nourished. No distress.  HENT:  Head: Normocephalic and atraumatic.  Right Ear: External ear normal.  Left Ear: External ear normal.  Mouth/Throat: Oropharynx is clear and moist.  Nares are injected and congested  No sinus tenderness Clear rhinorrhea and post nasal drip   Eyes: Pupils are equal, round, and reactive to light. Conjunctivae and EOM are normal. Right eye exhibits no discharge. Left eye exhibits no discharge.  2-3 beats of horizontal nystagmus bilaterally   Neck: Normal range of motion. Neck supple.  Cardiovascular: Normal rate and normal heart sounds.  Pulmonary/Chest: Effort normal and breath sounds normal. No respiratory distress. She has no wheezes. She has no rales. She exhibits no tenderness.  Good air exch  Lymphadenopathy:    She has no cervical adenopathy.  Neurological: She is alert.  Skin: Skin is warm and dry. No rash noted.  Psychiatric: She has a normal mood and affect.          Assessment & Plan:   Problem List Items Addressed This Visit      Respiratory   Viral URI    Early in course, with some congestion and pnd/ adding to vertigo Disc symptomatic care - see instructions on AVS         Nervous and Auditory   BPV (benign positional vertigo) - Primary    Likely related to early uri/sinus symptoms   Reassuring exam- nystagmus and positional dizziness (not severe)  Px meclizine for prn use with caution of sedation  Handouts given on BNV and Epely maneuver  inst to change position slowly  Watch for other  neuro s/s such as speech change /weakness or facial droop Update if not starting to improve in a week or if worsening

## 2018-02-22 NOTE — Patient Instructions (Signed)
Stay hydrated- get some rest  Take the meclizine as needed  Change position very slowly  Caution with driving/etc  Take a look at the handouts   You are probably getting a cold   If you develop - severe headache or neck stiffness get to the ER  Update if not starting to improve in a week or if worsening

## 2018-02-22 NOTE — Assessment & Plan Note (Signed)
Early in course, with some congestion and pnd/ adding to vertigo Disc symptomatic care - see instructions on AVS

## 2018-02-22 NOTE — Assessment & Plan Note (Signed)
Likely related to early uri/sinus symptoms   Reassuring exam- nystagmus and positional dizziness (not severe)  Px meclizine for prn use with caution of sedation  Handouts given on BNV and Epely maneuver  inst to change position slowly  Watch for other neuro s/s such as speech change /weakness or facial droop Update if not starting to improve in a week or if worsening

## 2018-02-25 ENCOUNTER — Encounter: Payer: Self-pay | Admitting: Family Medicine

## 2018-02-25 DIAGNOSIS — H8113 Benign paroxysmal vertigo, bilateral: Secondary | ICD-10-CM

## 2018-02-26 NOTE — Telephone Encounter (Signed)
Referral to ENT Pt of NP [facility] Will route to [facility]

## 2018-02-26 NOTE — Telephone Encounter (Signed)
Hi Marne, this message was sent to me by [place]. I'm happy to take over, were you thinking ENT if no improvement?

## 2018-04-22 ENCOUNTER — Other Ambulatory Visit: Payer: Self-pay | Admitting: Obstetrics and Gynecology

## 2018-07-20 ENCOUNTER — Encounter: Payer: Managed Care, Other (non HMO) | Admitting: Obstetrics and Gynecology

## 2018-07-23 ENCOUNTER — Telehealth: Payer: Self-pay

## 2018-07-23 NOTE — Telephone Encounter (Signed)
Pt called to reschedule her annual exam.

## 2018-07-27 ENCOUNTER — Encounter: Payer: Managed Care, Other (non HMO) | Admitting: Obstetrics and Gynecology

## 2018-09-21 ENCOUNTER — Encounter: Payer: Self-pay | Admitting: Obstetrics and Gynecology

## 2018-09-21 ENCOUNTER — Ambulatory Visit (INDEPENDENT_AMBULATORY_CARE_PROVIDER_SITE_OTHER): Payer: Managed Care, Other (non HMO) | Admitting: Obstetrics and Gynecology

## 2018-09-21 ENCOUNTER — Other Ambulatory Visit: Payer: Self-pay

## 2018-09-21 VITALS — BP 120/66 | HR 64 | Ht 63.0 in | Wt 164.1 lb

## 2018-09-21 DIAGNOSIS — Z01419 Encounter for gynecological examination (general) (routine) without abnormal findings: Secondary | ICD-10-CM

## 2018-09-21 DIAGNOSIS — N951 Menopausal and female climacteric states: Secondary | ICD-10-CM | POA: Diagnosis not present

## 2018-09-21 DIAGNOSIS — E663 Overweight: Secondary | ICD-10-CM

## 2018-09-21 MED ORDER — MEDROXYPROGESTERONE ACETATE 2.5 MG PO TABS: 2.5000 mg | ORAL_TABLET | Freq: Every day | ORAL | 3 refills | Status: DC

## 2018-09-21 MED ORDER — ESTROGENS CONJUGATED 0.625 MG PO TABS
0.6250 mg | ORAL_TABLET | Freq: Every day | ORAL | 3 refills | Status: DC
Start: 2018-09-21 — End: 2019-10-23

## 2018-09-21 NOTE — Patient Instructions (Addendum)
Health Maintenance for Postmenopausal Women Menopause is a normal process in which your reproductive ability comes to an end. This process happens gradually over a span of months to years, usually between the ages of 62 and 89. Menopause is complete when you have missed 12 consecutive menstrual periods. It is important to talk with your health care provider about some of the most common conditions that affect postmenopausal women, such as heart disease, cancer, and bone loss (osteoporosis). Adopting a healthy lifestyle and getting preventive care can help to promote your health and wellness. Those actions can also lower your chances of developing some of these common conditions. What should I know about menopause? During menopause, you may experience a number of symptoms, such as:  Moderate-to-severe hot flashes.  Night sweats.  Decrease in sex drive.  Mood swings.  Headaches.  Tiredness.  Irritability.  Memory problems.  Insomnia. Choosing to treat or not to treat menopausal changes is an individual decision that you make with your health care provider. What should I know about hormone replacement therapy and supplements? Hormone therapy products are effective for treating symptoms that are associated with menopause, such as hot flashes and night sweats. Hormone replacement carries certain risks, especially as you become older. If you are thinking about using estrogen or estrogen with progestin treatments, discuss the benefits and risks with your health care provider. What should I know about heart disease and stroke? Heart disease, heart attack, and stroke become more likely as you age. This may be due, in part, to the hormonal changes that your body experiences during menopause. These can affect how your body processes dietary fats, triglycerides, and cholesterol. Heart attack and stroke are both medical emergencies. There are many things that you can do to help prevent heart disease  and stroke:  Have your blood pressure checked at least every 1-2 years. High blood pressure causes heart disease and increases the risk of stroke.  If you are 79-72 years old, ask your health care provider if you should take aspirin to prevent a heart attack or a stroke.  Do not use any tobacco products, including cigarettes, chewing tobacco, or electronic cigarettes. If you need help quitting, ask your health care provider.  It is important to eat a healthy diet and maintain a healthy weight. ? Be sure to include plenty of vegetables, fruits, low-fat dairy products, and lean protein. ? Avoid eating foods that are high in solid fats, added sugars, or salt (sodium).  Get regular exercise. This is one of the most important things that you can do for your health. ? Try to exercise for at least 150 minutes each week. The type of exercise that you do should increase your heart rate and make you sweat. This is known as moderate-intensity exercise. ? Try to do strengthening exercises at least twice each week. Do these in addition to the moderate-intensity exercise.  Know your numbers.Ask your health care provider to check your cholesterol and your blood glucose. Continue to have your blood tested as directed by your health care provider.  What should I know about cancer screening? There are several types of cancer. Take the following steps to reduce your risk and to catch any cancer development as early as possible. Breast Cancer  Practice breast self-awareness. ? This means understanding how your breasts normally appear and feel. ? It also means doing regular breast self-exams. Let your health care provider know about any changes, no matter how small.  If you are 40 or  older, have a clinician do a breast exam (clinical breast exam or CBE) every year. Depending on your age, family history, and medical history, it may be recommended that you also have a yearly breast X-ray (mammogram).  If you  have a family history of breast cancer, talk with your health care provider about genetic screening.  If you are at high risk for breast cancer, talk with your health care provider about having an MRI and a mammogram every year.  Breast cancer (BRCA) gene test is recommended for women who have family members with BRCA-related cancers. Results of the assessment will determine the need for genetic counseling and BRCA1 and for BRCA2 testing. BRCA-related cancers include these types: ? Breast. This occurs in males or females. ? Ovarian. ? Tubal. This may also be called fallopian tube cancer. ? Cancer of the abdominal or pelvic lining (peritoneal cancer). ? Prostate. ? Pancreatic. Cervical, Uterine, and Ovarian Cancer Your health care provider may recommend that you be screened regularly for cancer of the pelvic organs. These include your ovaries, uterus, and vagina. This screening involves a pelvic exam, which includes checking for microscopic changes to the surface of your cervix (Pap test).  For women ages 21-65, health care providers may recommend a pelvic exam and a Pap test every three years. For women ages 39-65, they may recommend the Pap test and pelvic exam, combined with testing for human papilloma virus (HPV), every five years. Some types of HPV increase your risk of cervical cancer. Testing for HPV may also be done on women of any age who have unclear Pap test results.  Other health care providers may not recommend any screening for nonpregnant women who are considered low risk for pelvic cancer and have no symptoms. Ask your health care provider if a screening pelvic exam is right for you.  If you have had past treatment for cervical cancer or a condition that could lead to cancer, you need Pap tests and screening for cancer for at least 20 years after your treatment. If Pap tests have been discontinued for you, your risk factors (such as having a new sexual partner) need to be reassessed  to determine if you should start having screenings again. Some women have medical problems that increase the chance of getting cervical cancer. In these cases, your health care provider may recommend that you have screening and Pap tests more often.  If you have a family history of uterine cancer or ovarian cancer, talk with your health care provider about genetic screening.  If you have vaginal bleeding after reaching menopause, tell your health care provider.  There are currently no reliable tests available to screen for ovarian cancer. Lung Cancer Lung cancer screening is recommended for adults 57-50 years old who are at high risk for lung cancer because of a history of smoking. A yearly low-dose CT scan of the lungs is recommended if you:  Currently smoke.  Have a history of at least 30 pack-years of smoking and you currently smoke or have quit within the past 15 years. A pack-year is smoking an average of one pack of cigarettes per day for one year. Yearly screening should:  Continue until it has been 15 years since you quit.  Stop if you develop a health problem that would prevent you from having lung cancer treatment. Colorectal Cancer  This type of cancer can be detected and can often be prevented.  Routine colorectal cancer screening usually begins at age 12 and continues through  age 63.  If you have risk factors for colon cancer, your health care provider may recommend that you be screened at an earlier age.  If you have a family history of colorectal cancer, talk with your health care provider about genetic screening.  Your health care provider may also recommend using home test kits to check for hidden blood in your stool.  A small camera at the end of a tube can be used to examine your colon directly (sigmoidoscopy or colonoscopy). This is done to check for the earliest forms of colorectal cancer.  Direct examination of the colon should be repeated every 5-10 years until  age 75. However, if early forms of precancerous polyps or small growths are found or if you have a family history or genetic risk for colorectal cancer, you may need to be screened more often. Skin Cancer  Check your skin from head to toe regularly.  Monitor any moles. Be sure to tell your health care provider: ? About any new moles or changes in moles, especially if there is a change in a mole's shape or color. ? If you have a mole that is larger than the size of a pencil eraser.  If any of your family members has a history of skin cancer, especially at a young age, talk with your health care provider about genetic screening.  Always use sunscreen. Apply sunscreen liberally and repeatedly throughout the day.  Whenever you are outside, protect yourself by wearing long sleeves, pants, a wide-brimmed hat, and sunglasses. What should I know about osteoporosis? Osteoporosis is a condition in which bone destruction happens more quickly than new bone creation. After menopause, you may be at an increased risk for osteoporosis. To help prevent osteoporosis or the bone fractures that can happen because of osteoporosis, the following is recommended:  If you are 59-59 years old, get at least 1,000 mg of calcium and at least 600 mg of vitamin D per day.  If you are older than age 36 but younger than age 32, get at least 1,200 mg of calcium and at least 600 mg of vitamin D per day.  If you are older than age 47, get at least 1,200 mg of calcium and at least 800 mg of vitamin D per day. Smoking and excessive alcohol intake increase the risk of osteoporosis. Eat foods that are rich in calcium and vitamin D, and do weight-bearing exercises several times each week as directed by your health care provider. What should I know about how menopause affects my mental health? Depression may occur at any age, but it is more common as you become older. Common symptoms of depression include:  Low or sad mood.   Changes in sleep patterns.  Changes in appetite or eating patterns.  Feeling an overall lack of motivation or enjoyment of activities that you previously enjoyed.  Frequent crying spells. Talk with your health care provider if you think that you are experiencing depression. What should I know about immunizations? It is important that you get and maintain your immunizations. These include:  Tetanus, diphtheria, and pertussis (Tdap) booster vaccine.  Influenza every year before the flu season begins.  Pneumonia vaccine.  Shingles vaccine. Your health care provider may also recommend other immunizations. This information is not intended to replace advice given to you by your health care provider. Make sure you discuss any questions you have with your health care provider. Document Released: 05/20/2005 Document Revised: 10/16/2015 Document Reviewed: 12/30/2014 Elsevier Interactive Patient Education  2019 [employer].

## 2018-09-21 NOTE — Progress Notes (Signed)
ANNUAL PREVENTATIVE CARE GYNECOLOGY  ENCOUNTER NOTE  Subjective:       Christine Schroeder is a 53 y.o. G0P0000 female here for a routine annual gynecologic exam. The patient is sexually active. The patient is taking hormone replacement therapy, has been on Prempro x 2 years. Patient denies post-menopausal vaginal bleeding. The patient wears seatbelts: yes. The patient participates in regular exercise: yes (walking daily). Has the patient ever been transfused or tattooed?: no. The patient reports that there is not domestic violence in her life.  Current complaints: 1.  Patient desires to discuss options for management of menopausal vasomotor symptoms. Notes that current treatment with Prempro is working. However costs ~ $200/month. She is also concerned about long-term use and risks of potential cancer   Gynecologic History Patient's last menstrual period was 07/12/2016. Contraception: post menopausal status Last Pap: 07/12/2017. Results were: normal.  Last mammogram: 2016, BIRADS 1. Results were: normal Last Colonoscopy: 11/03/2017.  Results were colon polyps. For repeat in 5 years.    Obstetric History OB History  Gravida Para Term Preterm AB Living  0 0 0 0 0 0  SAB TAB Ectopic Multiple Live Births  0 0 0 0      Past Medical History:  Diagnosis Date  . Allergic rhinitis   . Chickenpox   . Family history of adverse reaction to anesthesia    Mother - PONV  . GERD (gastroesophageal reflux disease)   . Increased BMI     Family History  Problem Relation Age of Onset  . Hypertension Mother   . Heart disease Maternal Uncle   . Stroke Maternal Grandmother   . Diabetes Maternal Grandmother   . Diabetes Maternal Grandfather   . Colon cancer Maternal Aunt   . Cancer Neg Hx     Past Surgical History:  Procedure Laterality Date  . COLONOSCOPY WITH PROPOFOL N/A 11/03/2017   Procedure: COLONOSCOPY WITH PROPOFOL;  Surgeon: Jonathon Bellows, MD;  Location: [facility] ENDOSCOPY;  Service:  Endoscopy;  Laterality: N/A;  . NO PAST SURGERIES    . SHOULDER ARTHROSCOPY Right 02/10/2016   Procedure: Limited arthroscopic debridement, arthroscopic SLAP tear, subacromial decompression, mini-open rotator cuff repair, and mini-open biceps tenodesis, right shoulder;  Surgeon: Corky Mull, MD;  Location: [place];  Service: Orthopedics;  Laterality: Right;    Social History   Socioeconomic History  . Marital status: Married    Spouse name: Not on file  . Number of children: Not on file  . Years of education: Not on file  . Highest education level: Not on file  Occupational History  . Not on file  Social Needs  . Financial resource strain: Not on file  . Food insecurity    Worry: Not on file    Inability: Not on file  . Transportation needs    Medical: Not on file    Non-medical: Not on file  Tobacco Use  . Smoking status: Never Smoker  . Smokeless tobacco: Never Used  Substance and Sexual Activity  . Alcohol use: Yes    Alcohol/week: 0.0 standard drinks    Comment: Holidays  . Drug use: No  . Sexual activity: Yes    Birth control/protection: Post-menopausal  Lifestyle  . Physical activity    Days per week: 6 days    Minutes per session: 60 min  . Stress: Not on file  Relationships  . Social Herbalist on phone: Not on file    Gets together: Not on  file    Attends religious service: Not on file    Active member of club or organization: Not on file    Attends meetings of clubs or organizations: Not on file    Relationship status: Not on file  . Intimate partner violence    Fear of current or ex partner: Not on file    Emotionally abused: Not on file    Physically abused: Not on file    Forced sexual activity: Not on file  Other Topics Concern  . Not on file  Social History Narrative   Married.   No children.   Works for [employer] in [employer].   Enjoys hiking, spending time outdoors, reading, golfing.     Current  Outpatient Medications on File Prior to Visit  Medication Sig Dispense Refill  . cetirizine (ZYRTEC) 10 MG tablet Take 10 mg by mouth daily.     . Cholecalciferol (VITAMIN D PO) Take by mouth daily.    . diphenhydrAMINE (BENADRYL) 25 mg capsule Take 25 mg by mouth daily.     Marland Kitchen esomeprazole (NEXIUM) 20 MG capsule Take 20 mg by mouth daily at 12 noon.    Marland Kitchen LYSINE PO Take by mouth daily.    Marland Kitchen MAGNESIUM PO Take by mouth daily.    . Multiple Vitamin (MULTIVITAMIN) tablet Take 1 tablet by mouth daily.    Marland Kitchen PREMPRO 0.625-2.5 MG tablet TAKE 1 TABLET BY MOUTH  DAILY 84 tablet 0  . Pyridoxine HCl (VITAMIN B6 PO) Take by mouth daily.    . vitamin B-12 (CYANOCOBALAMIN) 1000 MCG tablet Take 1,000 mcg by mouth daily.     No current facility-administered medications on file prior to visit.     Allergies  Allergen Reactions  . Etodolac Other (See Comments)    Abdominal pain  . Meloxicam Other (See Comments)    Abdominal pain  . Penicillins Hives  . Prednisone Rash    Abdominal pain     Review of Systems ROS Review of Systems - General ROS: negative for - chills, fatigue, fever, hot flashes, night sweats, weight gain or weight loss Psychological ROS: negative for - anxiety, decreased libido, depression, mood swings, physical abuse or sexual abuse Ophthalmic ROS: negative for - blurry vision, eye pain or loss of vision ENT ROS: negative for - headaches, hearing change, visual changes or vocal changes Allergy and Immunology ROS: negative for - hives, itchy/watery eyes or seasonal allergies Hematological and Lymphatic ROS: negative for - bleeding problems, bruising, swollen lymph nodes or weight loss Endocrine ROS: negative for - galactorrhea, hair pattern changes, hot flashes, malaise/lethargy, mood swings, palpitations, polydipsia/polyuria, skin changes, temperature intolerance or unexpected weight changes Breast ROS: negative for - new or changing breast lumps or nipple discharge Respiratory  ROS: negative for - cough or shortness of breath Cardiovascular ROS: negative for - chest pain, irregular heartbeat, palpitations or shortness of breath Gastrointestinal ROS: no abdominal pain, change in bowel habits, or black or bloody stools Genito-Urinary ROS: no dysuria, trouble voiding, or hematuria Musculoskeletal ROS: negative for - joint pain or joint stiffness Neurological ROS: negative for - bowel and bladder control changes Dermatological ROS: negative for rash and skin lesion changes   Objective:   BP 120/66   Pulse 64   Ht 5\' 3"  (1.6 m)   Wt 164 lb 1.6 oz (74.4 kg)   LMP 07/12/2016   BMI 29.07 kg/m  CONSTITUTIONAL: Well-developed, well-nourished female in no acute distress. Overweight PSYCHIATRIC: Normal mood and affect.  Normal behavior. Normal judgment and thought content. Kathryn: Alert and oriented to person, place, and time. Normal muscle tone coordination. No cranial nerve deficit noted. HENT:  Normocephalic, atraumatic, External right and left ear normal. Oropharynx is clear and moist EYES: Conjunctivae and EOM are normal. Pupils are equal, round, and reactive to light. No scleral icterus.  NECK: Normal range of motion, supple, no masses.  Normal thyroid.  SKIN: Skin is warm and dry. No rash noted. Not diaphoretic. No erythema. No pallor. CARDIOVASCULAR: Normal heart rate noted, regular rhythm, no murmur. RESPIRATORY: Clear to auscultation bilaterally. Effort and breath sounds normal, no problems with respiration noted. BREASTS: Symmetric in size. No masses, skin changes, nipple drainage, or lymphadenopathy. ABDOMEN: Soft, normal bowel sounds, no distention noted.  No tenderness, rebound or guarding.  BLADDER: Normal PELVIC:  Bladder no bladder distension noted  Urethra: normal appearing urethra with no masses, tenderness or lesions  Vulva: normal appearing vulva with no masses, tenderness or lesions  Vagina: normal appearing vagina with normal color and  discharge, no lesions  Cervix: normal appearing cervix without discharge or lesions  Uterus: uterus is normal size, shape, consistency and nontender  Adnexa: normal adnexa in size, nontender and no masses  RV: External Exam NormaI, No Rectal Masses and Normal Sphincter tone  MUSCULOSKELETAL: Normal range of motion. No tenderness.  No cyanosis, clubbing, or edema.  2+ distal pulses. LYMPHATIC: No Axillary, Supraclavicular, or Inguinal Adenopathy.   Labs: Lab Results  Component Value Date   WBC 9.2 02/04/2015   HGB 13.8 02/04/2015   HCT 40.6 02/04/2015   MCV 90 02/04/2015   PLT 301 02/04/2015    Lab Results  Component Value Date   CREATININE 0.70 06/15/2016   BUN 15 06/15/2016   NA 141 06/15/2016   K 4.3 06/15/2016   CL 100 06/15/2016   CO2 24 06/15/2016    Lab Results  Component Value Date   ALT 21 02/04/2015   AST 26 02/04/2015   ALKPHOS 28 (L) 02/04/2015   BILITOT 0.2 02/04/2015    Lab Results  Component Value Date   CHOL 148 07/17/2017   HDL 47 07/17/2017   LDLCALC 85 07/17/2017   TRIG 80 07/17/2017   CHOLHDL 3.1 07/17/2017    Lab Results  Component Value Date   TSH 1.790 07/17/2017    Lab Results  Component Value Date   HGBA1C 5.6 07/17/2017     Assessment:    Encounter for well woman exam with routine gynecological exam  Vasomotor symptoms due to menopause Overweight (BMI 25.0-29.9)   Plan:  Pap: Not done.  Pap smear up to date. Next due in 2021.  Mammogram: Ordered Stool Guaiac Testing:  Not Indicated Labs: Not performed. Patient to be scheduled with PCP in the next 1-2 months.  Routine preventative health maintenance measures emphasized: Exercise/Diet/Weight control, Alcohol/Substance use risks and Stress Management.  Discussed HRT.  Advised that a generic could be prescribed due to cost (however would require taking 2 pills of separate components of estrogen and progesterone), or could prescribe a non-hormonal method. Also discussed using  hormone therapy and concerns about increased risk of heart disease, cerebrovascular disease, thromboembolic disease,  and breast cancer. She  is advised that she may wish to discontinue the HRT at any time, and encouraged to discuss this with her other physicians also. Her personal risk factors have been reviewed carefully with her today. Patient opts to continue HRT, but will change to generic.  To f/u in 1 year for  annual exam.      Return to [place], [place]

## 2018-09-21 NOTE — Progress Notes (Signed)
PT is present today for her annual exam. Pt stated that she has been doing self-breast exams monthly. Pt stated that she is doing well and denies any issues. No problems or concerns.     

## 2018-10-16 ENCOUNTER — Encounter: Payer: Self-pay | Admitting: Obstetrics and Gynecology

## 2019-01-07 ENCOUNTER — Other Ambulatory Visit: Payer: Self-pay

## 2019-01-07 ENCOUNTER — Ambulatory Visit (INDEPENDENT_AMBULATORY_CARE_PROVIDER_SITE_OTHER): Payer: Managed Care, Other (non HMO) | Admitting: Primary Care

## 2019-01-07 ENCOUNTER — Encounter: Payer: Self-pay | Admitting: Primary Care

## 2019-01-07 VITALS — BP 124/70 | HR 59 | Temp 98.6°F | Ht 63.0 in | Wt 167.0 lb

## 2019-01-07 DIAGNOSIS — E559 Vitamin D deficiency, unspecified: Secondary | ICD-10-CM

## 2019-01-07 DIAGNOSIS — H539 Unspecified visual disturbance: Secondary | ICD-10-CM | POA: Diagnosis not present

## 2019-01-07 DIAGNOSIS — R7303 Prediabetes: Secondary | ICD-10-CM | POA: Diagnosis not present

## 2019-01-07 NOTE — Assessment & Plan Note (Signed)
Noted per optometrist several weeks ago. ROS without obvious signs of diabetes except for vision changes.  A1C, CBC, CMP, Lipid panel pending.  Discussed the importance of a healthy diet and regular exercise in order for weight loss, and to reduce the risk of any potential medical problems.

## 2019-01-07 NOTE — Patient Instructions (Signed)
Proceed to [facility] drawing station as discussed.  Start exercising. You should be getting 150 minutes of moderate intensity exercise weekly.  It's important to improve your diet by reducing consumption of fast food, fried food, processed snack foods, sugary drinks. Increase consumption of fresh vegetables and fruits, whole grains, water.  Ensure you are drinking 64 ounces of water daily.  It was a pleasure to see you today!

## 2019-01-07 NOTE — Assessment & Plan Note (Signed)
Repeat A1C pending. Encouraged healthy diet and regular exercise.

## 2019-01-07 NOTE — Progress Notes (Signed)
Subjective:    Patient ID: Christine Schroeder, female    DOB: [birth year], 53 y.o.   MRN: [id number]  HPI  Christine Schroeder is a 53 year old female with a history of prediabetes, GERD, palpitations, vasomotor symptoms who presents today with a chief complaint of vision changes.  She visited her optometrist about two weeks ago for general follow up. During that visit her vision was noted to have significantly changed, she also noticed headaches. Contact prescription was changed and she noticed improvement. During her follow up visit her optometrist noticed that her eyes didn't seem usual, especially given the visual changes, so he referred her here for diabetes screening.   She denies chest pain, dizziness, shortness of breath. Headaches have improved. 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 Has undergone cortisone injections recently with improvement.   Review of Systems  Constitutional: Negative for unexpected weight change.  HENT: Positive for postnasal drip. Negative for rhinorrhea.   Eyes: Positive for visual disturbance.  Respiratory: Negative for cough and shortness of breath.   Cardiovascular: Negative for chest pain.  Gastrointestinal: Negative for constipation and diarrhea.  Endocrine: Negative for polydipsia and polyuria.  Genitourinary: Negative for difficulty urinating.  Skin: Negative for rash.  Allergic/Immunologic: Positive for environmental allergies.  Neurological: Positive for headaches. Negative for dizziness and numbness.       Past Medical History:  Diagnosis Date   Allergic rhinitis    Chickenpox    Family history of adverse reaction to anesthesia    Mother - PONV   GERD (gastroesophageal reflux disease)    Increased BMI      Social History   Socioeconomic History   Marital status: Married    Spouse name: Not on file   Number of children: Not on file   Years of education: Not on file   Highest  education level: Not on file  Occupational History   Not on file  Social Needs   Financial resource strain: Not on file   Food insecurity    Worry: Not on file    Inability: Not on file   Transportation needs    Medical: Not on file    Non-medical: Not on file  Tobacco Use   Smoking status: Never Smoker   Smokeless tobacco: Never Used  Substance and Sexual Activity   Alcohol use: Yes    Alcohol/week: 0.0 standard drinks    Comment: Holidays   Drug use: No   Sexual activity: Yes    Birth control/protection: Post-menopausal  Lifestyle   Physical activity    Days per week: 6 days    Minutes per session: 60 min   Stress: Not on file  Relationships   Social connections    Talks on phone: Not on file    Gets together: Not on file    Attends religious service: Not on file    Active member of club or organization: Not on file    Attends meetings of clubs or organizations: Not on file    Relationship status: Not on file   Intimate partner violence    Fear of current or ex partner: Not on file    Emotionally abused: Not on file    Physically abused: Not on file    Forced sexual activity: Not on file  Other Topics Concern   Not on file  Social History Narrative   Married.   No children.   Works for [employer] in [employer].  Enjoys hiking, spending time outdoors, reading, golfing.     Past Surgical History:  Procedure Laterality Date   COLONOSCOPY WITH PROPOFOL N/A 11/03/2017   Procedure: COLONOSCOPY WITH PROPOFOL;  Surgeon: Jonathon Bellows, MD;  Location: [facility] ENDOSCOPY;  Service: Endoscopy;  Laterality: N/A;   NO PAST SURGERIES     SHOULDER ARTHROSCOPY Right 02/10/2016   Procedure: Limited arthroscopic debridement, arthroscopic SLAP tear, subacromial decompression, mini-open rotator cuff repair, and mini-open biceps tenodesis, right shoulder;  Surgeon: Corky Mull, MD;  Location: [place];  Service: Orthopedics;  Laterality: Right;     Family History  Problem Relation Age of Onset   Hypertension Mother    Heart disease Maternal Uncle    Stroke Maternal Grandmother    Diabetes Maternal Grandmother    Diabetes Maternal Grandfather    Colon cancer Maternal Aunt    Cancer Neg Hx     Allergies  Allergen Reactions   Etodolac Other (See Comments)    Abdominal pain   Meloxicam Other (See Comments)    Abdominal pain   Penicillins Hives   Prednisone Rash    Abdominal pain    Current Outpatient Medications on File Prior to Visit  Medication Sig Dispense Refill   CALCIUM PO Take by mouth.     cetirizine (ZYRTEC) 10 MG tablet Take 10 mg by mouth daily.      Cholecalciferol (VITAMIN D PO) Take by mouth daily.     diphenhydrAMINE (BENADRYL) 25 mg capsule Take 25 mg by mouth daily.      esomeprazole (NEXIUM) 20 MG capsule Take 20 mg by mouth daily at 12 noon.     estrogens, conjugated, (PREMARIN) 0.625 MG tablet Take 1 tablet (0.625 mg total) by mouth daily. 90 tablet 3   LYSINE PO Take by mouth daily.     MAGNESIUM PO Take by mouth daily.     medroxyPROGESTERone (PROVERA) 2.5 MG tablet Take 1 tablet (2.5 mg total) by mouth daily. 90 tablet 3   Multiple Vitamin (MULTIVITAMIN) tablet Take 1 tablet by mouth daily.     No current facility-administered medications on file prior to visit.     BP 124/70    Pulse (!) 59    Temp 98.6 F (37 C) (Temporal)    Ht 5\' 3"  (1.6 m)    Wt 167 lb (75.8 kg)    LMP 07/12/2016    SpO2 98%    BMI 29.58 kg/m    Objective:   Physical Exam  Constitutional: She appears well-nourished.  Neck: Neck supple.  Cardiovascular: Normal rate and regular rhythm.  Respiratory: Effort normal and breath sounds normal.  Skin: Skin is warm and dry.  Psychiatric: She has a normal mood and affect.           Assessment & Plan:

## 2019-01-09 ENCOUNTER — Other Ambulatory Visit: Payer: Self-pay | Admitting: Primary Care

## 2019-01-09 DIAGNOSIS — R7303 Prediabetes: Secondary | ICD-10-CM

## 2019-01-09 DIAGNOSIS — E559 Vitamin D deficiency, unspecified: Secondary | ICD-10-CM

## 2019-01-10 LAB — COMPREHENSIVE METABOLIC PANEL
ALT: 18 IU/L (ref 0–32)
AST: 20 IU/L (ref 0–40)
Albumin/Globulin Ratio: 1.4 (ref 1.2–2.2)
Albumin: 4.1 g/dL (ref 3.8–4.9)
Alkaline Phosphatase: 33 IU/L — ABNORMAL LOW (ref 39–117)
BUN/Creatinine Ratio: 22 (ref 9–23)
BUN: 16 mg/dL (ref 6–24)
Bilirubin Total: 0.3 mg/dL (ref 0.0–1.2)
CO2: 24 mmol/L (ref 20–29)
Calcium: 9.2 mg/dL (ref 8.7–10.2)
Chloride: 102 mmol/L (ref 96–106)
Creatinine, Ser: 0.74 mg/dL (ref 0.57–1.00)
GFR calc Af Amer: 108 mL/min/{1.73_m2} (ref >59)
GFR calc non Af Amer: 93 mL/min/{1.73_m2} (ref >59)
Globulin, Total: 3 g/dL (ref 1.5–4.5)
Glucose: 94 mg/dL (ref 65–99)
Potassium: 4.4 mmol/L (ref 3.5–5.2)
Sodium: 140 mmol/L (ref 134–144)
Total Protein: 7.1 g/dL (ref 6.0–8.5)

## 2019-01-10 LAB — CBC
Hematocrit: 43.1 % (ref 34.0–46.6)
Hemoglobin: 14.2 g/dL (ref 11.1–15.9)
MCH: 29.5 pg (ref 26.6–33.0)
MCHC: 32.9 g/dL (ref 31.5–35.7)
MCV: 89 fL (ref 79–97)
Platelets: 259 10*3/uL (ref 150–450)
RBC: 4.82 x10E6/uL (ref 3.77–5.28)
RDW: 12.4 % (ref 11.7–15.4)
WBC: 6.4 10*3/uL (ref 3.4–10.8)

## 2019-01-10 LAB — LIPID PANEL
Chol/HDL Ratio: 3.6 ratio (ref 0.0–4.4)
Cholesterol, Total: 174 mg/dL (ref 100–199)
HDL: 48 mg/dL (ref >39)
LDL Chol Calc (NIH): 106 mg/dL — ABNORMAL HIGH (ref 0–99)
Triglycerides: 112 mg/dL (ref 0–149)
VLDL Cholesterol Cal: 20 mg/dL (ref 5–40)

## 2019-01-10 LAB — HEMOGLOBIN A1C
Est. average glucose Bld gHb Est-mCnc: 117 mg/dL
Hgb A1c MFr Bld: 5.7 % — ABNORMAL HIGH (ref 4.8–5.6)

## 2019-01-10 LAB — VITAMIN D 25 HYDROXY (VIT D DEFICIENCY, FRACTURES): Vit D, 25-Hydroxy: 42.3 ng/mL (ref 30.0–100.0)

## 2019-07-26 DIAGNOSIS — R7309 Other abnormal glucose: Secondary | ICD-10-CM | POA: Insufficient documentation

## 2019-07-26 DIAGNOSIS — E7800 Pure hypercholesterolemia, unspecified: Secondary | ICD-10-CM | POA: Insufficient documentation

## 2019-09-10 DIAGNOSIS — Z8614 Personal history of Methicillin resistant Staphylococcus aureus infection: Secondary | ICD-10-CM

## 2019-09-10 HISTORY — DX: Personal history of Methicillin resistant Staphylococcus aureus infection: Z86.14

## 2019-09-24 ENCOUNTER — Encounter: Payer: Managed Care, Other (non HMO) | Admitting: Obstetrics and Gynecology

## 2019-09-27 ENCOUNTER — Encounter: Payer: Managed Care, Other (non HMO) | Admitting: Obstetrics and Gynecology

## 2019-10-22 ENCOUNTER — Encounter: Payer: Managed Care, Other (non HMO) | Admitting: Obstetrics and Gynecology

## 2019-10-23 ENCOUNTER — Other Ambulatory Visit: Payer: Self-pay

## 2019-10-23 MED ORDER — MEDROXYPROGESTERONE ACETATE 2.5 MG PO TABS: 2.5000 mg | ORAL_TABLET | Freq: Every day | ORAL | 0 refills | Status: DC

## 2019-10-23 MED ORDER — ESTROGENS CONJUGATED 0.625 MG PO TABS: 0.6250 mg | ORAL_TABLET | Freq: Every day | ORAL | 0 refills | Status: DC

## 2019-11-26 ENCOUNTER — Ambulatory Visit (INDEPENDENT_AMBULATORY_CARE_PROVIDER_SITE_OTHER): Payer: Managed Care, Other (non HMO) | Admitting: Obstetrics and Gynecology

## 2019-11-26 ENCOUNTER — Other Ambulatory Visit: Payer: Self-pay

## 2019-11-26 ENCOUNTER — Encounter: Payer: Self-pay | Admitting: Obstetrics and Gynecology

## 2019-11-26 VITALS — BP 127/82 | HR 61 | Ht 63.0 in | Wt 159.9 lb

## 2019-11-26 DIAGNOSIS — E663 Overweight: Secondary | ICD-10-CM

## 2019-11-26 DIAGNOSIS — N951 Menopausal and female climacteric states: Secondary | ICD-10-CM

## 2019-11-26 DIAGNOSIS — Z8614 Personal history of Methicillin resistant Staphylococcus aureus infection: Secondary | ICD-10-CM | POA: Diagnosis not present

## 2019-11-26 DIAGNOSIS — Z01419 Encounter for gynecological examination (general) (routine) without abnormal findings: Secondary | ICD-10-CM | POA: Diagnosis not present

## 2019-11-26 DIAGNOSIS — R7303 Prediabetes: Secondary | ICD-10-CM | POA: Diagnosis not present

## 2019-11-26 NOTE — Progress Notes (Addendum)
ANNUAL PREVENTATIVE CARE GYNECOLOGY  ENCOUNTER NOTE  Subjective:       Christine Schroeder is a 54 y.o. G0P0000 female here for a routine annual gynecologic exam. The patient is sexually active. She reports using hormone replacement therapy, has been on therapy for ~ 2 years. Patient denies post-menopausal vaginal bleeding. The patient wears seatbelts: yes. The patient participates in regular exercise: yes (walking daily). Has the patient ever been transfused or tattooed?: no. The patient reports that there is not domestic violence in her life.  Current complaints: 1.  Patient desires to discuss options for management of menopausal vasomotor symptoms. Notes that current treatment with Premarin and Provera is not working as well as her Prempro (had to change to generic due to cost). However notes that even the generic is now costing ~ $200/month. She is also concerned about long-term use and risks of potential cancer.  2. She also reports that she currently is wearing an orthopedic boot on her left foot due to a MRSA infection after receiving a cortisol infection in her foot.  Due to the defect it left in her skin, using the boot to protect the area, has been using the boot for going on 7 weeks, has a total of 5 more weeks to go.    Gynecologic History Patient's last menstrual period was 07/12/2016. Contraception: post menopausal status Last Pap: 07/12/2017. Results were: normal.  Last mammogram: 10/2019, BIRADS 1 (performed at [facility]). Results were: normal Last Colonoscopy: 11/03/2017.  Results were colon polyps. For repeat in 5 years.    Obstetric History OB History  Gravida Para Term Preterm AB Living  0 0 0 0 0 0  SAB TAB Ectopic Multiple Live Births  0 0 0 0      Past Medical History:  Diagnosis Date  . Allergic rhinitis   . Chickenpox   . Family history of adverse reaction to anesthesia    Mother - PONV  . GERD (gastroesophageal reflux disease)   . History of MRSA infection 09/2019    MRSA infection of foot after cortisol injection.  . Increased BMI     Family History  Problem Relation Age of Onset  . Hypertension Mother   . Heart disease Maternal Uncle   . Stroke Maternal Grandmother   . Diabetes Maternal Grandmother   . Diabetes Maternal Grandfather   . Colon cancer Maternal Aunt   . Cancer Neg Hx     Past Surgical History:  Procedure Laterality Date  . COLONOSCOPY WITH PROPOFOL N/A 11/03/2017   Procedure: COLONOSCOPY WITH PROPOFOL;  Surgeon: Jonathon Bellows, MD;  Location: [facility] ENDOSCOPY;  Service: Endoscopy;  Laterality: N/A;  . NO PAST SURGERIES    . SHOULDER ARTHROSCOPY Right 02/10/2016   Procedure: Limited arthroscopic debridement, arthroscopic SLAP tear, subacromial decompression, mini-open rotator cuff repair, and mini-open biceps tenodesis, right shoulder;  Surgeon: Corky Mull, MD;  Location: [facility];  Service: Orthopedics;  Laterality: Right;    Social History   Socioeconomic History  . Marital status: Married    Spouse name: Not on file  . Number of children: Not on file  . Years of education: Not on file  . Highest education level: Not on file  Occupational History  . Not on file  Tobacco Use  . Smoking status: Never Smoker  . Smokeless tobacco: Never Used  Vaping Use  . Vaping Use: Never used  Substance and Sexual Activity  . Alcohol use: Not Currently    Alcohol/week: 0.0 standard  drinks    Comment: Holidays  . Drug use: No  . Sexual activity: Yes    Birth control/protection: Post-menopausal  Other Topics Concern  . Not on file  Social History Narrative   Married.   No children.   Works for [employer] in [employer].   Enjoys hiking, spending time outdoors, reading, golfing.    Social Determinants of Health   Financial Resource Strain:   . Difficulty of Paying Living Expenses:   Food Insecurity:   . Worried About Charity fundraiser in the Last Year:   . Arboriculturist in the Last Year:   Transportation  Needs:   . Film/video editor (Medical):   Marland Kitchen Lack of Transportation (Non-Medical):   Physical Activity:   . Days of Exercise per Week:   . Minutes of Exercise per Session:   Stress:   . Feeling of Stress :   Social Connections:   . Frequency of Communication with Friends and Family:   . Frequency of Social Gatherings with Friends and Family:   . Attends Religious Services:   . Active Member of Clubs or Organizations:   . Attends Archivist Meetings:   Marland Kitchen Marital Status:   Intimate Partner Violence:   . Fear of Current or Ex-Partner:   . Emotionally Abused:   Marland Kitchen Physically Abused:   . Sexually Abused:     Current Outpatient Medications on File Prior to Visit  Medication Sig Dispense Refill  . CALCIUM PO Take by mouth.    . cetirizine (ZYRTEC) 10 MG tablet Take 10 mg by mouth daily.     . Cholecalciferol (VITAMIN D PO) Take by mouth daily.    . diphenhydrAMINE (BENADRYL) 25 mg capsule Take 25 mg by mouth daily.     Marland Kitchen esomeprazole (NEXIUM) 20 MG capsule Take 20 mg by mouth daily at 12 noon.    . estrogens, conjugated, (PREMARIN) 0.625 MG tablet Take 1 tablet (0.625 mg total) by mouth daily. 90 tablet 0  . LYSINE PO Take by mouth daily.    Marland Kitchen MAGNESIUM PO Take by mouth daily.    . medroxyPROGESTERone (PROVERA) 2.5 MG tablet Take 1 tablet (2.5 mg total) by mouth daily. 90 tablet 0  . Multiple Vitamin (MULTIVITAMIN) tablet Take 1 tablet by mouth daily.     No current facility-administered medications on file prior to visit.    Allergies  Allergen Reactions  . Etodolac Other (See Comments)    Abdominal pain  . Meloxicam Other (See Comments)    Abdominal pain  . Penicillins Hives  . Prednisone Rash    Abdominal pain     Review of Systems ROS Review of Systems - General ROS: negative for - chills, fatigue, fever, weight gain or weight loss. Positive for hot flashes, night sweats,  Psychological ROS: negative for - anxiety, decreased libido, depression, mood  swings, physical abuse or sexual abuse Ophthalmic ROS: negative for - blurry vision, eye pain or loss of vision ENT ROS: negative for - headaches, hearing change, visual changes or vocal changes Allergy and Immunology ROS: negative for - hives, itchy/watery eyes or seasonal allergies Hematological and Lymphatic ROS: negative for - bleeding problems, bruising, swollen lymph nodes or weight loss Endocrine ROS: negative for - galactorrhea, hair pattern changes, malaise/lethargy, mood swings, palpitations, polydipsia/polyuria, skin changes, temperature intolerance or unexpected weight changes Breast ROS: negative for - new or changing breast lumps or nipple discharge Respiratory ROS: negative for - cough or shortness  of breath Cardiovascular ROS: negative for - chest pain, irregular heartbeat, palpitations or shortness of breath Gastrointestinal ROS: no abdominal pain, change in bowel habits, or black or bloody stools Genito-Urinary ROS: no dysuria, trouble voiding, or hematuria Musculoskeletal ROS: negative for - joint pain or joint stiffness Neurological ROS: negative for - bowel and bladder control changes Dermatological ROS: negative for rash and skin lesion changes   Objective:   BP 127/82   Pulse 61   Ht 5\' 3"  (1.6 m)   Wt 159 lb 14.4 oz (72.5 kg)   LMP 07/12/2016   BMI 28.33 kg/m  CONSTITUTIONAL: Well-developed, well-nourished female in no acute distress. Overweight PSYCHIATRIC: Normal mood and affect. Normal behavior. Normal judgment and thought content. Caruthers: Alert and oriented to person, place, and time. Normal muscle tone coordination. No cranial nerve deficit noted. HENT:  Normocephalic, atraumatic, External right and left ear normal. Oropharynx is clear and moist EYES: Conjunctivae and EOM are normal. Pupils are equal, round, and reactive to light. No scleral icterus.  NECK: Normal range of motion, supple, no masses.  Normal thyroid.  SKIN: Skin is warm and dry. No rash  noted. Not diaphoretic. No erythema. No pallor. CARDIOVASCULAR: Normal heart rate noted, regular rhythm, no murmur. RESPIRATORY: Clear to auscultation bilaterally. Effort and breath sounds normal, no problems with respiration noted. BREASTS: Symmetric in size. No masses, skin changes, nipple drainage, or lymphadenopathy. ABDOMEN: Soft, normal bowel sounds, no distention noted.  No tenderness, rebound or guarding.  BLADDER: Normal PELVIC:  Bladder no bladder distension noted  Urethra: normal appearing urethra with no masses, tenderness or lesions  Vulva: normal appearing vulva with no masses, tenderness or lesions  Vagina: normal appearing vagina with normal color and discharge, no lesions  Cervix: normal appearing cervix without discharge or lesions  Uterus: uterus is normal size, shape, consistency and nontender  Adnexa: normal adnexa in size, nontender and no masses  RV: External Exam NormaI, No Rectal Masses and Normal Sphincter tone  MUSCULOSKELETAL: Normal range of motion. No tenderness.  No cyanosis, clubbing, or edema.  2+ distal pulses. LYMPHATIC: No Axillary, Supraclavicular, or Inguinal Adenopathy.   Labs:   Lab Results  Component Value Date   ALT 18 01/09/2019   AST 20 01/09/2019   ALKPHOS 33 (L) 01/09/2019   BILITOT 0.3 01/09/2019    Lab Results  Component Value Date   CHOL 174 01/09/2019   HDL 48 01/09/2019   LDLCALC 106 (H) 01/09/2019   TRIG 112 01/09/2019   CHOLHDL 3.6 01/09/2019    Lab Results  Component Value Date   TSH 1.790 07/17/2017    Lab Results  Component Value Date   HGBA1C 5.7 (H) 01/09/2019     Assessment:    Encounter for well woman exam with routine gynecological exam  Vasomotor symptoms due to menopause Overweight (BMI 25.0-29.9)  History of MRSA infection Pre-diabetes  Plan:  - Pap: Not done.  Pap smear up to date. Next due in 2022.  - Mammogram: Up to date. Continue routine screening.  - Stool Guaiac Testing:  Not  Indicated. Up to date with colonoscopy.  - Labs: Currently up to date. Also notes having more recent labs with PCP in July.  Reviewed in [place].  Routine preventative health maintenance measures emphasized: Exercise/Diet/Weight control, Alcohol/Substance use risks and Stress Management.  - Discussed HRT options.  Advised that patient should contact her insurance company to see which medications are covered by her insurance in order to get cheaper options.  Also discussed using hormone therapy and concerns about increased risk of heart disease, cerebrovascular disease, thromboembolic disease, and breast cancer. She  is advised that she may wish to discontinue the HRT at any time. Recommended use is for shortest time period and lowest dose necessary to manage her symptoms. Discussed that risk does slightly increase with use longer than 5 years. Her personal risk factors have been reviewed. - Pre-diabetes - patient counseled on lifestyle changes, has made some dietary changes, notes losing ~ 6 lbs since last year's visit.  - COVID-19 Vaccination status: Patient declines COVID vaccination.   Return to [place], MD [facility]

## 2019-11-26 NOTE — Patient Instructions (Signed)
Health Maintenance for Postmenopausal Women Menopause is a normal process in which your ability to get pregnant comes to an end. This process happens slowly over many months or years, usually between the ages of 48 and 55. Menopause is complete when you have missed your menstrual periods for 12 months. It is important to talk with your health care provider about some of the most common conditions that affect women after menopause (postmenopausal women). These include heart disease, cancer, and bone loss (osteoporosis). Adopting a healthy lifestyle and getting preventive care can help to promote your health and wellness. The actions you take can also lower your chances of developing some of these common conditions. What should I know about menopause? During menopause, you may get a number of symptoms, such as:  Hot flashes. These can be moderate or severe.  Night sweats.  Decrease in sex drive.  Mood swings.  Headaches.  Tiredness.  Irritability.  Memory problems.  Insomnia. Choosing to treat or not to treat these symptoms is a decision that you make with your health care provider. Do I need hormone replacement therapy?  Hormone replacement therapy is effective in treating symptoms that are caused by menopause, such as hot flashes and night sweats.  Hormone replacement carries certain risks, especially as you become older. If you are thinking about using estrogen or estrogen with progestin, discuss the benefits and risks with your health care provider. What is my risk for heart disease and stroke? The risk of heart disease, heart attack, and stroke increases as you age. One of the causes may be a change in the body's hormones during menopause. This can affect how your body uses dietary fats, triglycerides, and cholesterol. Heart attack and stroke are medical emergencies. There are many things that you can do to help prevent heart disease and stroke. Watch your blood pressure  High  blood pressure causes heart disease and increases the risk of stroke. This is more likely to develop in people who have high blood pressure readings, are of African descent, or are overweight.  Have your blood pressure checked: ? Every 3-5 years if you are 18-39 years of age. ? Every year if you are 40 years old or older. Eat a healthy diet   Eat a diet that includes plenty of vegetables, fruits, low-fat dairy products, and lean protein.  Do not eat a lot of foods that are high in solid fats, added sugars, or sodium. Get regular exercise Get regular exercise. This is one of the most important things you can do for your health. Most adults should:  Try to exercise for at least 150 minutes each week. The exercise should increase your heart rate and make you sweat (moderate-intensity exercise).  Try to do strengthening exercises at least twice each week. Do these in addition to the moderate-intensity exercise.  Spend less time sitting. Even light physical activity can be beneficial. Other tips  Work with your health care provider to achieve or maintain a healthy weight.  Do not use any products that contain nicotine or tobacco, such as cigarettes, e-cigarettes, and chewing tobacco. If you need help quitting, ask your health care provider.  Know your numbers. Ask your health care provider to check your cholesterol and your blood sugar (glucose). Continue to have your blood tested as directed by your health care provider. Do I need screening for cancer? Depending on your health history and family history, you may need to have cancer screening at different stages of your life. This   may include screening for:  Breast cancer.  Cervical cancer.  Lung cancer.  Colorectal cancer. What is my risk for osteoporosis? After menopause, you may be at increased risk for osteoporosis. Osteoporosis is a condition in which bone destruction happens more quickly than new bone creation. To help prevent  osteoporosis or the bone fractures that can happen because of osteoporosis, you may take the following actions:  If you are 19-50 years old, get at least 1,000 mg of calcium and at least 600 mg of vitamin D per day.  If you are older than age 50 but younger than age 70, get at least 1,200 mg of calcium and at least 600 mg of vitamin D per day.  If you are older than age 70, get at least 1,200 mg of calcium and at least 800 mg of vitamin D per day. Smoking and drinking excessive alcohol increase the risk of osteoporosis. Eat foods that are rich in calcium and vitamin D, and do weight-bearing exercises several times each week as directed by your health care provider. How does menopause affect my mental health? Depression may occur at any age, but it is more common as you become older. Common symptoms of depression include:  Low or sad mood.  Changes in sleep patterns.  Changes in appetite or eating patterns.  Feeling an overall lack of motivation or enjoyment of activities that you previously enjoyed.  Frequent crying spells. Talk with your health care provider if you think that you are experiencing depression. General instructions See your health care provider for regular wellness exams and vaccines. This may include:  Scheduling regular health, dental, and eye exams.  Getting and maintaining your vaccines. These include: ? Influenza vaccine. Get this vaccine each year before the flu season begins. ? Pneumonia vaccine. ? Shingles vaccine. ? Tetanus, diphtheria, and pertussis (Tdap) booster vaccine. Your health care provider may also recommend other immunizations. Tell your health care provider if you have ever been abused or do not feel safe at home. Summary  Menopause is a normal process in which your ability to get pregnant comes to an end.  This condition causes hot flashes, night sweats, decreased interest in sex, mood swings, headaches, or lack of sleep.  Treatment for this  condition may include hormone replacement therapy.  Take actions to keep yourself healthy, including exercising regularly, eating a healthy diet, watching your weight, and checking your blood pressure and blood sugar levels.  Get screened for cancer and depression. Make sure that you are up to date with all your vaccines. This information is not intended to replace advice given to you by your health care provider. Make sure you discuss any questions you have with your health care provider. Document Revised: 03/21/2018 Document Reviewed: 03/21/2018 Elsevier Patient Education  2020 Elsevier Inc.    Breast Self-Awareness Breast self-awareness means being familiar with how your breasts look and feel. It involves checking your breasts regularly and reporting any changes to your health care provider. Practicing breast self-awareness is important. Sometimes changes may not be harmful (are benign), but sometimes a change in your breasts can be a sign of a serious medical problem. It is important to learn how to do this procedure correctly so that you can catch problems early, when treatment is more likely to be successful. All women should practice breast self-awareness, including women who have had breast implants. What you need:  A mirror.  A well-lit room. How to do a breast self-exam A breast self-exam is   one way to learn what is normal for your breasts and whether your breasts are changing. To do a breast self-exam: Look for changes  1. Remove all the clothing above your waist. 2. Stand in front of a mirror in a room with good lighting. 3. Put your hands on your hips. 4. Push your hands firmly downward. 5. Compare your breasts in the mirror. Look for differences between them (asymmetry), such as: ? Differences in shape. ? Differences in size. ? Puckers, dips, and bumps in one breast and not the other. 6. Look at each breast for changes in the skin, such as: ? Redness. ? Scaly  areas. 7. Look for changes in your nipples, such as: ? Discharge. ? Bleeding. ? Dimpling. ? Redness. ? A change in position. Feel for changes Carefully feel your breasts for lumps and changes. It is best to do this while lying on your back on the floor, and again while sitting or standing in the tub or shower with soapy water on your skin. Feel each breast in the following way: 1. Place the arm on the side of the breast you are examining above your head. 2. Feel your breast with the other hand. 3. Start in the nipple area and make -inch (2 cm) overlapping circles to feel your breast. Use the pads of your three middle fingers to do this. Apply light pressure, then medium pressure, then firm pressure. The light pressure will allow you to feel the tissue closest to the skin. The medium pressure will allow you to feel the tissue that is a little deeper. The firm pressure will allow you to feel the tissue close to the ribs. 4. Continue the overlapping circles, moving downward over the breast until you feel your ribs below your breast. 5. Move one finger-width toward the center of the body. Continue to use the -inch (2 cm) overlapping circles to feel your breast as you move slowly up toward your collarbone. 6. Continue the up-and-down exam using all three pressures until you reach your armpit.  Write down what you find Writing down what you find can help you remember what to discuss with your health care provider. Write down:  What is normal for each breast.  Any changes that you find in each breast, including: ? The kind of changes you find. ? Any pain or tenderness. ? Size and location of any lumps.  Where you are in your menstrual cycle, if you are still menstruating. General tips and recommendations  Examine your breasts every month.  If you are breastfeeding, the best time to examine your breasts is after a feeding or after using a breast pump.  If you menstruate, the best time to  examine your breasts is 5-7 days after your period. Breasts are generally lumpier during menstrual periods, and it may be more difficult to notice changes.  With time and practice, you will become more familiar with the variations in your breasts and more comfortable with the exam. Contact a health care provider if you:  See a change in the shape or size of your breasts or nipples.  See a change in the skin of your breast or nipples, such as a reddened or scaly area.  Have unusual discharge from your nipples.  Find a lump or thick area that was not there before.  Have pain in your breasts.  Have any concerns related to your breast health. Summary  Breast self-awareness includes looking for physical changes in your breasts, as well   as feeling for any changes within your breasts.  Breast self-awareness should be performed in front of a mirror in a well-lit room.  You should examine your breasts every month. If you menstruate, the best time to examine your breasts is 5-7 days after your menstrual period.  Let your health care provider know of any changes you notice in your breasts, including changes in size, changes on the skin, pain or tenderness, or unusual fluid from your nipples. This information is not intended to replace advice given to you by your health care provider. Make sure you discuss any questions you have with your health care provider. Document Revised: 11/14/2017 Document Reviewed: 11/14/2017 Elsevier Patient Education  2020 Elsevier Inc.  

## 2019-11-26 NOTE — Progress Notes (Signed)
Pt present for annual exam. Pt stated that she was doing well. Pap and mammogram up to date.

## 2019-12-07 ENCOUNTER — Emergency Department: Payer: Managed Care, Other (non HMO)

## 2019-12-07 ENCOUNTER — Inpatient Hospital Stay
Admission: EM | Admit: 2019-12-07 | Discharge: 2019-12-10 | DRG: 504 | Disposition: A | Discharge status: Home / Self Care | Payer: Managed Care, Other (non HMO) | Attending: Internal Medicine | Admitting: Internal Medicine

## 2019-12-07 ENCOUNTER — Other Ambulatory Visit: Payer: Self-pay

## 2019-12-07 DIAGNOSIS — B952 Enterococcus as the cause of diseases classified elsewhere: Secondary | ICD-10-CM | POA: Diagnosis present

## 2019-12-07 DIAGNOSIS — Z8249 Family history of ischemic heart disease and other diseases of the circulatory system: Secondary | ICD-10-CM

## 2019-12-07 DIAGNOSIS — M86172 Other acute osteomyelitis, left ankle and foot: Secondary | ICD-10-CM

## 2019-12-07 DIAGNOSIS — M65872 Other synovitis and tenosynovitis, left ankle and foot: Secondary | ICD-10-CM | POA: Diagnosis present

## 2019-12-07 DIAGNOSIS — L089 Local infection of the skin and subcutaneous tissue, unspecified: Secondary | ICD-10-CM | POA: Diagnosis present

## 2019-12-07 DIAGNOSIS — Z888 Allergy status to other drugs, medicaments and biological substances status: Secondary | ICD-10-CM

## 2019-12-07 DIAGNOSIS — M25475 Effusion, left foot: Secondary | ICD-10-CM | POA: Diagnosis present

## 2019-12-07 DIAGNOSIS — Z800 Family history of malignant neoplasm of digestive organs: Secondary | ICD-10-CM

## 2019-12-07 DIAGNOSIS — Z79890 Hormone replacement therapy (postmenopausal): Secondary | ICD-10-CM

## 2019-12-07 DIAGNOSIS — L97526 Non-pressure chronic ulcer of other part of left foot with bone involvement without evidence of necrosis: Secondary | ICD-10-CM | POA: Diagnosis present

## 2019-12-07 DIAGNOSIS — Z20822 Contact with and (suspected) exposure to covid-19: Secondary | ICD-10-CM | POA: Diagnosis present

## 2019-12-07 DIAGNOSIS — M00872 Arthritis due to other bacteria, left ankle and foot: Principal | ICD-10-CM | POA: Diagnosis present

## 2019-12-07 DIAGNOSIS — L02612 Cutaneous abscess of left foot: Secondary | ICD-10-CM | POA: Diagnosis not present

## 2019-12-07 DIAGNOSIS — B9562 Methicillin resistant Staphylococcus aureus infection as the cause of diseases classified elsewhere: Secondary | ICD-10-CM | POA: Diagnosis present

## 2019-12-07 DIAGNOSIS — M009 Pyogenic arthritis, unspecified: Secondary | ICD-10-CM | POA: Diagnosis present

## 2019-12-07 DIAGNOSIS — K219 Gastro-esophageal reflux disease without esophagitis: Secondary | ICD-10-CM | POA: Diagnosis present

## 2019-12-07 DIAGNOSIS — N951 Menopausal and female climacteric states: Secondary | ICD-10-CM | POA: Diagnosis present

## 2019-12-07 DIAGNOSIS — E11621 Type 2 diabetes mellitus with foot ulcer: Secondary | ICD-10-CM | POA: Diagnosis present

## 2019-12-07 DIAGNOSIS — M21612 Bunion of left foot: Secondary | ICD-10-CM | POA: Diagnosis present

## 2019-12-07 DIAGNOSIS — Z880 Allergy status to penicillin: Secondary | ICD-10-CM

## 2019-12-07 DIAGNOSIS — L97529 Non-pressure chronic ulcer of other part of left foot with unspecified severity: Secondary | ICD-10-CM | POA: Diagnosis present

## 2019-12-07 DIAGNOSIS — J309 Allergic rhinitis, unspecified: Secondary | ICD-10-CM | POA: Diagnosis present

## 2019-12-07 DIAGNOSIS — Z833 Family history of diabetes mellitus: Secondary | ICD-10-CM

## 2019-12-07 DIAGNOSIS — Z823 Family history of stroke: Secondary | ICD-10-CM

## 2019-12-07 LAB — CBC WITH DIFFERENTIAL/PLATELET
Abs Immature Granulocytes: 0.01 10*3/uL (ref 0.00–0.07)
Basophils Absolute: 0 10*3/uL (ref 0.0–0.1)
Basophils Relative: 0 %
Eosinophils Absolute: 0.1 10*3/uL (ref 0.0–0.5)
Eosinophils Relative: 2 %
HCT: 40.4 % (ref 36.0–46.0)
Hemoglobin: 14.2 g/dL (ref 12.0–15.0)
Immature Granulocytes: 0 %
Lymphocytes Relative: 16 %
Lymphs Abs: 0.9 10*3/uL (ref 0.7–4.0)
MCH: 30.4 pg (ref 26.0–34.0)
MCHC: 35.1 g/dL (ref 30.0–36.0)
MCV: 86.5 fL (ref 80.0–100.0)
Monocytes Absolute: 0.4 10*3/uL (ref 0.1–1.0)
Monocytes Relative: 8 %
Neutro Abs: 4.2 10*3/uL (ref 1.7–7.7)
Neutrophils Relative %: 74 %
Platelets: 238 10*3/uL (ref 150–400)
RBC: 4.67 MIL/uL (ref 3.87–5.11)
RDW: 12.5 % (ref 11.5–15.5)
WBC: 5.6 10*3/uL (ref 4.0–10.5)
nRBC: 0 % (ref 0.0–0.2)

## 2019-12-07 LAB — COMPREHENSIVE METABOLIC PANEL
ALT: 23 U/L (ref 0–44)
AST: 27 U/L (ref 15–41)
Albumin: 4.2 g/dL (ref 3.5–5.0)
Alkaline Phosphatase: 26 U/L — ABNORMAL LOW (ref 38–126)
Anion gap: 11 (ref 5–15)
BUN: 13 mg/dL (ref 6–20)
CO2: 25 mmol/L (ref 22–32)
Calcium: 9.2 mg/dL (ref 8.9–10.3)
Chloride: 98 mmol/L (ref 98–111)
Creatinine, Ser: 0.89 mg/dL (ref 0.44–1.00)
GFR calc Af Amer: >60 mL/min (ref >60)
GFR calc non Af Amer: >60 mL/min (ref >60)
Glucose, Bld: 122 mg/dL — ABNORMAL HIGH (ref 70–99)
Potassium: 3.5 mmol/L (ref 3.5–5.1)
Sodium: 134 mmol/L — ABNORMAL LOW (ref 135–145)
Total Bilirubin: 0.6 mg/dL (ref 0.3–1.2)
Total Protein: 7.7 g/dL (ref 6.5–8.1)

## 2019-12-07 NOTE — ED Triage Notes (Signed)
Patient reports having fever and nausea.  Patient reports temp at home was 101.

## 2019-12-08 ENCOUNTER — Emergency Department: Payer: Managed Care, Other (non HMO)

## 2019-12-08 ENCOUNTER — Inpatient Hospital Stay: Admit: 2019-12-08 | Payer: Managed Care, Other (non HMO) | Admitting: Podiatry

## 2019-12-08 ENCOUNTER — Inpatient Hospital Stay: Payer: Managed Care, Other (non HMO) | Admitting: Anesthesiology

## 2019-12-08 ENCOUNTER — Encounter: Payer: Self-pay | Admitting: Internal Medicine

## 2019-12-08 ENCOUNTER — Encounter
Admission: EM | Disposition: A | Discharge status: Home / Self Care | Payer: Self-pay | Source: Home / Self Care | Attending: Internal Medicine

## 2019-12-08 ENCOUNTER — Other Ambulatory Visit: Payer: Self-pay

## 2019-12-08 DIAGNOSIS — M86172 Other acute osteomyelitis, left ankle and foot: Secondary | ICD-10-CM

## 2019-12-08 DIAGNOSIS — Z8249 Family history of ischemic heart disease and other diseases of the circulatory system: Secondary | ICD-10-CM | POA: Diagnosis not present

## 2019-12-08 DIAGNOSIS — L97526 Non-pressure chronic ulcer of other part of left foot with bone involvement without evidence of necrosis: Secondary | ICD-10-CM | POA: Diagnosis present

## 2019-12-08 DIAGNOSIS — M25475 Effusion, left foot: Secondary | ICD-10-CM | POA: Diagnosis present

## 2019-12-08 DIAGNOSIS — J309 Allergic rhinitis, unspecified: Secondary | ICD-10-CM | POA: Diagnosis present

## 2019-12-08 DIAGNOSIS — Z79890 Hormone replacement therapy (postmenopausal): Secondary | ICD-10-CM | POA: Diagnosis not present

## 2019-12-08 DIAGNOSIS — B9562 Methicillin resistant Staphylococcus aureus infection as the cause of diseases classified elsewhere: Secondary | ICD-10-CM | POA: Diagnosis present

## 2019-12-08 DIAGNOSIS — L02612 Cutaneous abscess of left foot: Secondary | ICD-10-CM | POA: Diagnosis present

## 2019-12-08 DIAGNOSIS — L089 Local infection of the skin and subcutaneous tissue, unspecified: Secondary | ICD-10-CM | POA: Diagnosis not present

## 2019-12-08 DIAGNOSIS — B952 Enterococcus as the cause of diseases classified elsewhere: Secondary | ICD-10-CM | POA: Diagnosis present

## 2019-12-08 DIAGNOSIS — N951 Menopausal and female climacteric states: Secondary | ICD-10-CM | POA: Diagnosis present

## 2019-12-08 DIAGNOSIS — Z823 Family history of stroke: Secondary | ICD-10-CM | POA: Diagnosis not present

## 2019-12-08 DIAGNOSIS — Z20822 Contact with and (suspected) exposure to covid-19: Secondary | ICD-10-CM | POA: Diagnosis present

## 2019-12-08 DIAGNOSIS — Z888 Allergy status to other drugs, medicaments and biological substances status: Secondary | ICD-10-CM | POA: Diagnosis not present

## 2019-12-08 DIAGNOSIS — M009 Pyogenic arthritis, unspecified: Secondary | ICD-10-CM | POA: Diagnosis present

## 2019-12-08 DIAGNOSIS — M869 Osteomyelitis, unspecified: Secondary | ICD-10-CM | POA: Insufficient documentation

## 2019-12-08 DIAGNOSIS — E11621 Type 2 diabetes mellitus with foot ulcer: Secondary | ICD-10-CM | POA: Diagnosis present

## 2019-12-08 DIAGNOSIS — M65872 Other synovitis and tenosynovitis, left ankle and foot: Secondary | ICD-10-CM | POA: Diagnosis present

## 2019-12-08 DIAGNOSIS — M21612 Bunion of left foot: Secondary | ICD-10-CM | POA: Diagnosis present

## 2019-12-08 DIAGNOSIS — Z880 Allergy status to penicillin: Secondary | ICD-10-CM | POA: Diagnosis not present

## 2019-12-08 DIAGNOSIS — L97529 Non-pressure chronic ulcer of other part of left foot with unspecified severity: Secondary | ICD-10-CM | POA: Diagnosis present

## 2019-12-08 DIAGNOSIS — K219 Gastro-esophageal reflux disease without esophagitis: Secondary | ICD-10-CM | POA: Diagnosis present

## 2019-12-08 DIAGNOSIS — M00872 Arthritis due to other bacteria, left ankle and foot: Secondary | ICD-10-CM | POA: Diagnosis present

## 2019-12-08 DIAGNOSIS — Z800 Family history of malignant neoplasm of digestive organs: Secondary | ICD-10-CM | POA: Diagnosis not present

## 2019-12-08 DIAGNOSIS — Z833 Family history of diabetes mellitus: Secondary | ICD-10-CM | POA: Diagnosis not present

## 2019-12-08 HISTORY — PX: IRRIGATION AND DEBRIDEMENT FOOT: SHX6602

## 2019-12-08 LAB — LACTIC ACID, PLASMA: Lactic Acid, Venous: 1.3 mmol/L (ref 0.5–1.9)

## 2019-12-08 LAB — SARS CORONAVIRUS 2 BY RT PCR (HOSPITAL ORDER, PERFORMED IN ~~LOC~~ HOSPITAL LAB): SARS Coronavirus 2: NEGATIVE

## 2019-12-08 SURGERY — IRRIGATION AND DEBRIDEMENT FOOT
Anesthesia: General | Laterality: Left

## 2019-12-08 MED ORDER — VANCOMYCIN HCL IN DEXTROSE 1-5 GM/200ML-% IV SOLN
1000.0000 mg | Freq: Two times a day (BID) | INTRAVENOUS | Status: DC
  Administered 2019-12-08 – 2019-12-10 (×4): 1000 mg via INTRAVENOUS
  Filled 2019-12-08 (×7): qty 200

## 2019-12-08 MED ORDER — BUPIVACAINE LIPOSOME 1.3 % IJ SUSP
INTRAMUSCULAR | Status: DC | PRN
  Administered 2019-12-08 (×2): 5 mL

## 2019-12-08 MED ORDER — ONDANSETRON HCL 4 MG PO TABS: 4.0000 mg | ORAL_TABLET | Freq: Four times a day (QID) | ORAL | Status: DC | PRN

## 2019-12-08 MED ORDER — ACETAMINOPHEN 650 MG RE SUPP: 650.0000 mg | Freq: Four times a day (QID) | RECTAL | Status: DC | PRN

## 2019-12-08 MED ORDER — DEXAMETHASONE SODIUM PHOSPHATE 10 MG/ML IJ SOLN
INTRAMUSCULAR | Status: DC | PRN
  Administered 2019-12-08: 10 mg via INTRAVENOUS

## 2019-12-08 MED ORDER — SODIUM CHLORIDE 0.9 % IV SOLN
1.0000 g | Freq: Once | INTRAVENOUS | Status: AC
  Administered 2019-12-08: 1 g via INTRAVENOUS
  Filled 2019-12-08: qty 10

## 2019-12-08 MED ORDER — VANCOMYCIN HCL IN DEXTROSE 1-5 GM/200ML-% IV SOLN: 1000.0000 mg | Freq: Two times a day (BID) | INTRAVENOUS | Status: DC

## 2019-12-08 MED ORDER — VANCOMYCIN HCL 750 MG/150ML IV SOLN
750.0000 mg | Freq: Once | INTRAVENOUS | Status: AC
  Administered 2019-12-08: 750 mg via INTRAVENOUS
  Filled 2019-12-08: qty 150

## 2019-12-08 MED ORDER — CHLORHEXIDINE GLUCONATE 4 % EX LIQD: 60.0000 mL | Freq: Once | CUTANEOUS | Status: DC

## 2019-12-08 MED ORDER — BUPIVACAINE HCL 0.5 % IJ SOLN
INTRAMUSCULAR | Status: DC | PRN
  Administered 2019-12-08: 5 mL

## 2019-12-08 MED ORDER — LIDOCAINE HCL (CARDIAC) PF 100 MG/5ML IV SOSY
PREFILLED_SYRINGE | INTRAVENOUS | Status: DC | PRN
  Administered 2019-12-08: 70 mg via INTRAVENOUS

## 2019-12-08 MED ORDER — GADOBUTROL 1 MMOL/ML IV SOLN
6.0000 mL | Freq: Once | INTRAVENOUS | Status: AC | PRN
  Administered 2019-12-08: 7.5 mL via INTRAVENOUS

## 2019-12-08 MED ORDER — SODIUM CHLORIDE 0.9 % IV SOLN
INTRAVENOUS | Status: DC | PRN
  Administered 2019-12-08 – 2019-12-09 (×3): 250 mL via INTRAVENOUS

## 2019-12-08 MED ORDER — POLYETHYLENE GLYCOL 3350 17 G PO PACK: 17.0000 g | PACK | Freq: Every day | ORAL | Status: DC | PRN

## 2019-12-08 MED ORDER — BUPIVACAINE LIPOSOME 1.3 % IJ SUSP
INTRAMUSCULAR | Status: AC
  Filled 2019-12-08: qty 20

## 2019-12-08 MED ORDER — ACETAMINOPHEN 325 MG PO TABS
650.0000 mg | ORAL_TABLET | Freq: Four times a day (QID) | ORAL | Status: DC | PRN
  Administered 2019-12-09 – 2019-12-10 (×3): 650 mg via ORAL
  Filled 2019-12-08 (×3): qty 2

## 2019-12-08 MED ORDER — PROPOFOL 10 MG/ML IV BOLUS
INTRAVENOUS | Status: DC | PRN
  Administered 2019-12-08: 140 mg via INTRAVENOUS
  Administered 2019-12-08: 10 mg via INTRAVENOUS

## 2019-12-08 MED ORDER — PROMETHAZINE HCL 25 MG/ML IJ SOLN: 6.2500 mg | INTRAMUSCULAR | Status: DC | PRN

## 2019-12-08 MED ORDER — SODIUM CHLORIDE 0.9 % IV SOLN
1.0000 g | INTRAVENOUS | Status: DC
  Administered 2019-12-09 – 2019-12-10 (×2): 1 g via INTRAVENOUS
  Filled 2019-12-08: qty 1
  Filled 2019-12-08: qty 10
  Filled 2019-12-08: qty 1

## 2019-12-08 MED ORDER — FENTANYL CITRATE (PF) 100 MCG/2ML IJ SOLN
INTRAMUSCULAR | Status: DC | PRN
Start: 2019-12-08 — End: 2019-12-08
  Administered 2019-12-08 (×4): 25 ug via INTRAVENOUS

## 2019-12-08 MED ORDER — MIDAZOLAM HCL 2 MG/2ML IJ SOLN
INTRAMUSCULAR | Status: DC | PRN
  Administered 2019-12-08: 2 mg via INTRAVENOUS

## 2019-12-08 MED ORDER — ONDANSETRON HCL 4 MG/2ML IJ SOLN
4.0000 mg | Freq: Four times a day (QID) | INTRAMUSCULAR | Status: DC | PRN
  Administered 2019-12-08: 4 mg via INTRAVENOUS

## 2019-12-08 MED ORDER — LACTATED RINGERS IV SOLN: INTRAVENOUS | Status: DC | PRN

## 2019-12-08 MED ORDER — VANCOMYCIN HCL IN DEXTROSE 1-5 GM/200ML-% IV SOLN
1000.0000 mg | Freq: Once | INTRAVENOUS | Status: AC
  Administered 2019-12-08: 1000 mg via INTRAVENOUS
  Filled 2019-12-08: qty 200

## 2019-12-08 MED ORDER — OXYCODONE-ACETAMINOPHEN 5-325 MG PO TABS
1.0000 | ORAL_TABLET | Freq: Four times a day (QID) | ORAL | Status: DC | PRN
  Administered 2019-12-09: 2 via ORAL
  Filled 2019-12-08: qty 2

## 2019-12-08 MED ORDER — GLYCOPYRROLATE 0.2 MG/ML IJ SOLN
INTRAMUSCULAR | Status: DC | PRN
  Administered 2019-12-08: .2 mg via INTRAVENOUS

## 2019-12-08 MED ORDER — OXYCODONE HCL 5 MG PO TABS: 5.0000 mg | ORAL_TABLET | ORAL | Status: DC | PRN

## 2019-12-08 MED ORDER — ACETAMINOPHEN 10 MG/ML IV SOLN
INTRAVENOUS | Status: DC | PRN
  Administered 2019-12-08: 1000 mg via INTRAVENOUS

## 2019-12-08 MED ORDER — FENTANYL CITRATE (PF) 100 MCG/2ML IJ SOLN: 25.0000 ug | INTRAMUSCULAR | Status: DC | PRN

## 2019-12-08 MED ORDER — POVIDONE-IODINE 10 % EX SWAB
2.0000 "application " | Freq: Once | CUTANEOUS | Status: DC
  Filled 2019-12-08: qty 2

## 2019-12-08 SURGICAL SUPPLY — 62 items
BLADE OSC/SAGITTAL MD 5.5X18 (BLADE) IMPLANT
BLADE OSCILLATING/SAGITTAL (BLADE)
BLADE SW THK.38XMED LNG THN (BLADE) IMPLANT
BNDG COHESIVE 4X5 TAN STRL (GAUZE/BANDAGES/DRESSINGS) ×2 IMPLANT
BNDG COHESIVE 6X5 TAN STRL LF (GAUZE/BANDAGES/DRESSINGS) ×2 IMPLANT
BNDG CONFORM 2 STRL LF (GAUZE/BANDAGES/DRESSINGS) ×2 IMPLANT
BNDG CONFORM 3 STRL LF (GAUZE/BANDAGES/DRESSINGS) ×2 IMPLANT
BNDG ELASTIC 4X5.8 VLCR STR LF (GAUZE/BANDAGES/DRESSINGS) ×2 IMPLANT
BNDG ESMARK 4X12 TAN STRL LF (GAUZE/BANDAGES/DRESSINGS) ×2 IMPLANT
BNDG GAUZE 4.5X4.1 6PLY STRL (MISCELLANEOUS) ×2 IMPLANT
CANISTER SUCT 1200ML W/VALVE (MISCELLANEOUS) ×2 IMPLANT
CANISTER SUCT 3000ML PPV (MISCELLANEOUS) ×2 IMPLANT
COVER WAND RF STERILE (DRAPES) ×2 IMPLANT
CUFF TOURN SGL QUICK 12 (TOURNIQUET CUFF) ×1 IMPLANT
CUFF TOURN SGL QUICK 18X4 (TOURNIQUET CUFF) IMPLANT
DRAPE FLUOR MINI C-ARM 54X84 (DRAPES) IMPLANT
DRAPE XRAY CASSETTE 23X24 (DRAPES) IMPLANT
DRSG MEPILEX FLEX 3X3 (GAUZE/BANDAGES/DRESSINGS) IMPLANT
DURAPREP 26ML APPLICATOR (WOUND CARE) ×2 IMPLANT
ELECT REM PT RETURN 9FT ADLT (ELECTROSURGICAL) ×2
ELECTRODE REM PT RTRN 9FT ADLT (ELECTROSURGICAL) ×1 IMPLANT
GAUZE PACKING 1/4 X5 YD (GAUZE/BANDAGES/DRESSINGS) ×2 IMPLANT
GAUZE PACKING IODOFORM 1X5 (PACKING) ×2 IMPLANT
GAUZE SPONGE 4X4 12PLY STRL (GAUZE/BANDAGES/DRESSINGS) ×2 IMPLANT
GAUZE XEROFORM 1X8 LF (GAUZE/BANDAGES/DRESSINGS) ×2 IMPLANT
GLOVE BIO SURGEON STRL SZ7.5 (GLOVE) ×2 IMPLANT
GLOVE INDICATOR 8.0 STRL GRN (GLOVE) ×2 IMPLANT
GOWN STRL REUS W/ TWL XL LVL3 (GOWN DISPOSABLE) ×2 IMPLANT
GOWN STRL REUS W/TWL MED LVL3 (GOWN DISPOSABLE) ×4 IMPLANT
GOWN STRL REUS W/TWL XL LVL3 (GOWN DISPOSABLE) ×4
HANDPIECE VERSAJET DEBRIDEMENT (MISCELLANEOUS) IMPLANT
IV NS 1000ML (IV SOLUTION) ×2
IV NS 1000ML BAXH (IV SOLUTION) ×1 IMPLANT
KIT TURNOVER KIT A (KITS) ×2 IMPLANT
LABEL OR SOLS (LABEL) ×2 IMPLANT
NDL FILTER BLUNT 18X1 1/2 (NEEDLE) ×1 IMPLANT
NDL HYPO 25X1 1.5 SAFETY (NEEDLE) ×1 IMPLANT
NEEDLE FILTER BLUNT 18X 1/2SAF (NEEDLE) ×1
NEEDLE FILTER BLUNT 18X1 1/2 (NEEDLE) ×1 IMPLANT
NEEDLE HYPO 25X1 1.5 SAFETY (NEEDLE) ×2 IMPLANT
NS IRRIG 500ML POUR BTL (IV SOLUTION) ×1 IMPLANT
PACK EXTREMITY (MISCELLANEOUS) ×2 IMPLANT
PAD ABD DERMACEA PRESS 5X9 (GAUZE/BANDAGES/DRESSINGS) ×2 IMPLANT
PULSAVAC PLUS IRRIG FAN TIP (DISPOSABLE)
RASP SM TEAR CROSS CUT (RASP) IMPLANT
SHIELD FULL FACE ANTIFOG 7M (MISCELLANEOUS) ×2 IMPLANT
SOL .9 NS 3000ML IRR  AL (IV SOLUTION)
SOL .9 NS 3000ML IRR AL (IV SOLUTION)
SOL .9 NS 3000ML IRR UROMATIC (IV SOLUTION) ×1 IMPLANT
SOL PREP PVP 2OZ (MISCELLANEOUS) ×2
SOLUTION PREP PVP 2OZ (MISCELLANEOUS) ×1 IMPLANT
STOCKINETTE IMPERVIOUS 9X36 MD (GAUZE/BANDAGES/DRESSINGS) ×2 IMPLANT
SUT ETHILON 2 0 FS 18 (SUTURE) ×2 IMPLANT
SUT ETHILON 4-0 (SUTURE)
SUT ETHILON 4-0 FS2 18XMFL BLK (SUTURE)
SUT VIC AB 3-0 SH 27 (SUTURE)
SUT VIC AB 3-0 SH 27X BRD (SUTURE) ×1 IMPLANT
SUT VIC AB 4-0 FS2 27 (SUTURE) ×1 IMPLANT
SUTURE ETHLN 4-0 FS2 18XMF BLK (SUTURE) ×1 IMPLANT
SWAB CULTURE AMIES ANAERIB BLU (MISCELLANEOUS) ×1 IMPLANT
SYR 10ML LL (SYRINGE) ×4 IMPLANT
TIP FAN IRRIG PULSAVAC PLUS (DISPOSABLE) ×1 IMPLANT

## 2019-12-08 NOTE — ED Notes (Signed)
Provided pt pillows at this time.

## 2019-12-08 NOTE — Anesthesia Preprocedure Evaluation (Signed)
Anesthesia Evaluation  Patient identified by MRN, date of birth, ID band Patient awake    Reviewed: Allergy & Precautions, NPO status , Patient's Chart, lab work & pertinent test results, reviewed documented beta blocker date and time   History of Anesthesia Complications (+) Family history of anesthesia reactionNegative for: history of anesthetic complications  Airway Mallampati: III  TM Distance: >3 FB     Dental  (+) Dental Advidsory Given, Teeth Intact   Pulmonary neg pulmonary ROS,    Pulmonary exam normal        Cardiovascular Exercise Tolerance: Good + Peripheral Vascular Disease  negative cardio ROS Normal cardiovascular exam     Neuro/Psych negative neurological ROS  negative psych ROS   GI/Hepatic Neg liver ROS, GERD  ,  Endo/Other  negative endocrine ROS  Renal/GU negative Renal ROS     Musculoskeletal   Abdominal   Peds  Hematology   Anesthesia Other Findings Past Medical History: No date: Allergic rhinitis No date: Chickenpox No date: Family history of adverse reaction to anesthesia     Comment:  Mother - PONV No date: GERD (gastroesophageal reflux disease) 09/2019: History of MRSA infection     Comment:  MRSA infection of foot after cortisol injection. No date: Increased BMI   Reproductive/Obstetrics negative OB ROS                             Anesthesia Physical  Anesthesia Plan  ASA: II  Anesthesia Plan: General   Post-op Pain Management:    Induction: Intravenous  PONV Risk Score and Plan: 3 and Ondansetron, Midazolam, Dexamethasone, Treatment may vary due to age or medical condition and Promethazine  Airway Management Planned: LMA and Oral ETT  Additional Equipment:   Intra-op Plan:   Post-operative Plan: Extubation in OR  Informed Consent: I have reviewed the patients History and Physical, chart, labs and discussed the procedure including the  risks, benefits and alternatives for the proposed anesthesia with the patient or authorized representative who has indicated his/her understanding and acceptance.       Plan Discussed with: CRNA  Anesthesia Plan Comments:         Anesthesia Quick Evaluation

## 2019-12-08 NOTE — Consult Note (Addendum)
Pharmacy Antibiotic Note  Christine Schroeder is a 54 y.o. female admitted on 12/07/2019 with osteomyelitis of the left foot. Pt was prescribed bactrim and cipro from podiatrist last week. Pharmacy has been consulted for vancomycin dosing.  Plan: Pt received vancomycin 1 g in ED - will give additional 750 mg IV x 1 dose. Vancomycin 1000 mg IV every 12 hours.  Goal trough 15-20 mcg/mL.  Monitor Scr daily and plan for levels per protocol.   Height: 5\' 2"  (157.5 cm) Weight: 70.8 kg (156 lb) IBW/kg (Calculated) : 50.1  Temp (24hrs), Avg:99.6 F (37.6 C), Min:99.4 F (37.4 C), Max:99.8 F (37.7 C)  Recent Labs  Lab 12/07/19 1930 12/08/19 0001  WBC 5.6  --   CREATININE 0.89  --   LATICACIDVEN  --  1.3    Estimated Creatinine Clearance: 67.4 mL/min (by C-G formula based on SCr of 0.89 mg/dL).    Allergies  Allergen Reactions  . Etodolac Other (See Comments)    Abdominal pain  . Meloxicam Other (See Comments)    Abdominal pain  . Penicillins Hives  . Prednisone Rash    Abdominal pain    Antimicrobials this admission: 8/29 CTX >>  8/29 vancomycin >>   Microbiology results: 8/29 BCx: NGTD  Thank you for allowing pharmacy to be a part of this patient's care.  Benn Moulder, PharmD Pharmacy Resident  12/08/2019 9:04 AM

## 2019-12-08 NOTE — Anesthesia Procedure Notes (Signed)
Procedure Name: LMA Insertion Performed by: Fletcher-Harrison, Shuayb Schepers, CRNA Pre-anesthesia Checklist: Patient identified, Emergency Drugs available, Suction available and Patient being monitored Patient Re-evaluated:Patient Re-evaluated prior to induction Oxygen Delivery Method: Circle system utilized Preoxygenation: Pre-oxygenation with 100% oxygen Induction Type: IV induction Ventilation: Mask ventilation without difficulty LMA: LMA inserted LMA Size: 4.0 Placement Confirmation: positive ETCO2,  CO2 detector and breath sounds checked- equal and bilateral Tube secured with: Tape Dental Injury: Teeth and Oropharynx as per pre-operative assessment        

## 2019-12-08 NOTE — Progress Notes (Signed)
Patient underwent incision and drainage of left fifth MTPJ by myself.  MRI was concerning for osteomyelitis of the fifth metatarsal head and base of the proximal phalanx.  Intraoperative findings found the bone and cartilage of the fifth metatarsal head and base of the proximal phalanx to be intact.  No obvious gross erosive changes or fracture within the bone was noted intraoperatively.  A culture of the joint fluid of the fifth metatarsal phalangeal joint was performed.  A second culture of the ulcerative site was performed as well.  At this time we will monitor for further changes of the bone.  Would recommend treating for possible osteomyelitis based on the MRI findings and will continue to monitor cultures and serial x-rays outpatient.  I have consulted infectious disease for their assistance.

## 2019-12-08 NOTE — Op Note (Signed)
Operative note   Surgeon:Conception Doebler Lawyer: None    Preop diagnosis: Septic fifth metatarsophalangeal joint with possible osteomyelitis    Postop diagnosis: Septic fifth metatarsophalangeal joint with ulcer    Procedure: Incision and drainage fifth metatarsophalangeal joint    EBL: Minimal    Anesthesia:local and general.  Local consisted of a total of 5 cc of 0.5% bupivacaine and 10 cc of Exparel long-acting anesthetic    Hemostasis: Ankle tourniquet inflated 200 mmHg for 25 minutes    Specimen: Deep wound culture of the fifth metatarsophalangeal joint fluid and wound culture of the plantar fifth metatarsophalangeal joint ulceration    Complications: None    Operative indications:Christine Schroeder is an 54 y.o. that presents today for surgical intervention.  The risks/benefits/alternatives/complications have been discussed and consent has been given.    Procedure:  Patient was brought into the OR and placed on the operating table in thesupine position. After anesthesia was obtained theleft lower extremity was prepped and draped in usual sterile fashion.  A dorsal lateral incision was placed overlying the fifth MTPJ.  Sharp and blunt dissection was taken down to the capsule.  A longitudinal capsulotomy was then performed.  At this time the joint was then explored.  Some obvious synovitis of the fifth MTPJ was noted.  A deep wound culture of the joint and fluid was then obtained at this time.  The wound was then flushed with copious amounts of irrigation.  Careful inspection of the fifth metatarsal head as well as the base of the proximal phalanx and cartilage did not reveal any obvious erosive changes as the cortex was intact diffusely throughout.  At this time I deferred bone biopsy as all bone cortex and cartilage was intact.  Next the plantar lateral ulcerative site was then debrided down to the level of the bone.  This did penetrate through the capsule at the most lateral  aspect of the fifth MTPJ.  All nonviable tissue was sharply removed.  The wound was then flushed with copious amounts of irrigation with a bulb syringe.  A total of 500 cc was used.  At this time closure of the capsule was then performed with a 4-0 Vicryl and the skin dorsolaterally was closed with a 4-0 nylon.  The plantar lateral ulcerative site was packed with a iodoform packing.  A bulky sterile dressing was applied to the left foot.    Patient tolerated the procedure and anesthesia well.  Was transported from the OR to the PACU with all vital signs stable and vascular status intact.   Will be readmitted to the floor.  We have consulted infectious disease.  Patient is to remain nonweightbearing to the left foot.  Can weight-bear to the heel if needed for transfer.

## 2019-12-08 NOTE — Plan of Care (Signed)
Continuing with plan of care. 

## 2019-12-08 NOTE — Progress Notes (Signed)
15 minute call to floor. 

## 2019-12-08 NOTE — ED Notes (Signed)
Report given to [facility], [place] nurse.

## 2019-12-08 NOTE — Transfer of Care (Signed)
Immediate Anesthesia Transfer of Care Note  Patient: Christine Schroeder  Procedure(s) Performed: IRRIGATION AND DEBRIDEMENT FOOT (Left )  Patient Location: PACU  Anesthesia Type:General  Level of Consciousness: drowsy  Airway & Oxygen Therapy: Patient Spontanous Breathing and Patient connected to face mask oxygen  Post-op Assessment: Report given to RN and Post -op Vital signs reviewed and stable  Post vital signs: Reviewed and stable  Last Vitals:  Vitals Value Taken Time  BP 90/56 12/08/19 1803  Temp    Pulse 81 12/08/19 1804  Resp 16 12/08/19 1804  SpO2 99 % 12/08/19 1804  Vitals shown include unvalidated device data.  Last Pain:  Vitals:   12/08/19 0902  TempSrc: Oral  PainSc:          Complications: No complications documented.

## 2019-12-08 NOTE — ED Notes (Signed)
Pt ambulatory to the restroom at this time.  

## 2019-12-08 NOTE — Progress Notes (Signed)
Left foot pink in color, warm to touch.

## 2019-12-08 NOTE — ED Notes (Signed)
Instructed pt to removed all clothing and place them in a bag including jewelry at this time.

## 2019-12-08 NOTE — Consult Note (Signed)
ORTHOPAEDIC CONSULTATION  REQUESTING PHYSICIAN: Fritzi Mandes, MD  Chief Complaint: Left foot infection  HPI: Christine Schroeder is a 54 y.o. female who complains of draining infection to her left foot.  She states she has had issues with this since back in May.  She had an injection in his left fifth toe for redness and swelling.  Since that time she has had cultures and has been on different rounds of antibiotics.  She states she is had MRSA.  Was seen by an outside podiatrist.  I do not have access to his record.  She has been on Cipro and Bactrim since last week.  Worsening pain with fever and nausea more recently.  She does states she had an x-ray performed recently that showed some subluxation of her fifth toe joint.  X-ray in the hospital shows subluxation of the fifth toe and MRI is concerning for osteomyelitis.  Consult for possible debridement at this time.  Past Medical History:  Diagnosis Date  . Allergic rhinitis   . Chickenpox   . Family history of adverse reaction to anesthesia    Mother - PONV  . GERD (gastroesophageal reflux disease)   . History of MRSA infection 09/2019   MRSA infection of foot after cortisol injection.  . Increased BMI    Past Surgical History:  Procedure Laterality Date  . COLONOSCOPY WITH PROPOFOL N/A 11/03/2017   Procedure: COLONOSCOPY WITH PROPOFOL;  Surgeon: Jonathon Bellows, MD;  Location: [facility] ENDOSCOPY;  Service: Endoscopy;  Laterality: N/A;  . NO PAST SURGERIES    . SHOULDER ARTHROSCOPY Right 02/10/2016   Procedure: Limited arthroscopic debridement, arthroscopic SLAP tear, subacromial decompression, mini-open rotator cuff repair, and mini-open biceps tenodesis, right shoulder;  Surgeon: Corky Mull, MD;  Location: [facility];  Service: Orthopedics;  Laterality: Right;   Social History   Socioeconomic History  . Marital status: Married    Spouse name: Not on file  . Number of children: Not on file  . Years of education: Not on file  .  Highest education level: Not on file  Occupational History  . Not on file  Tobacco Use  . Smoking status: Never Smoker  . Smokeless tobacco: Never Used  Vaping Use  . Vaping Use: Never used  Substance and Sexual Activity  . Alcohol use: Not Currently    Alcohol/week: 0.0 standard drinks    Comment: Holidays  . Drug use: No  . Sexual activity: Yes    Birth control/protection: Post-menopausal  Other Topics Concern  . Not on file  Social History Narrative   Married.   No children.   Works for [employer] in [employer].   Enjoys hiking, spending time outdoors, reading, golfing.    Social Determinants of Health   Financial Resource Strain:   . Difficulty of Paying Living Expenses: Not on file  Food Insecurity:   . Worried About Charity fundraiser in the Last Year: Not on file  . Ran Out of Food in the Last Year: Not on file  Transportation Needs:   . Lack of Transportation (Medical): Not on file  . Lack of Transportation (Non-Medical): Not on file  Physical Activity:   . Days of Exercise per Week: Not on file  . Minutes of Exercise per Session: Not on file  Stress:   . Feeling of Stress : Not on file  Social Connections:   . Frequency of Communication with Friends and Family: Not on file  . Frequency of Social Gatherings  with Friends and Family: Not on file  . Attends Religious Services: Not on file  . Active Member of Clubs or Organizations: Not on file  . Attends Archivist Meetings: Not on file  . Marital Status: Not on file   Family History  Problem Relation Age of Onset  . Hypertension Mother   . Heart disease Maternal Uncle   . Stroke Maternal Grandmother   . Diabetes Maternal Grandmother   . Diabetes Maternal Grandfather   . Colon cancer Maternal Aunt   . Cancer Neg Hx    Allergies  Allergen Reactions  . Penicillins Hives  . Etodolac Other (See Comments)    Abdominal pain  . Meloxicam Other (See Comments)    Abdominal pain  .  Prednisone Rash    Abdominal pain   Prior to Admission medications   Medication Sig Start Date End Date Taking? Authorizing Provider  cetirizine (ZYRTEC) 10 MG tablet Take 10 mg by mouth daily.    Yes [provider]  ciprofloxacin (CIPRO) 750 MG tablet Take 750 mg by mouth 2 (two) times daily. 12/05/19  Yes [provider]  esomeprazole (NEXIUM) 20 MG capsule Take 20 mg by mouth daily at 12 noon.   Yes [provider]  estrogens, conjugated, (PREMARIN) 0.625 MG tablet Take 1 tablet (0.625 mg total) by mouth daily. 10/23/19  Yes Rubie Maid, MD  medroxyPROGESTERone (PROVERA) 2.5 MG tablet Take 1 tablet (2.5 mg total) by mouth daily. 10/23/19  Yes Rubie Maid, MD  Multiple Vitamin (MULTIVITAMIN) tablet Take 1 tablet by mouth daily.   Yes [provider]  sulfamethoxazole-trimethoprim (BACTRIM DS) 800-160 MG tablet Take 1 tablet by mouth 2 (two) times daily. 11/29/19  Yes [provider]  diphenhydrAMINE (BENADRYL) 25 mg capsule Take 25 mg by mouth daily.     [provider]   MR FOOT LEFT W WO CONTRAST  Result Date: 12/08/2019 CLINICAL DATA:  Left foot wound near the fifth MTP joint. EXAM: MRI OF THE LEFT FOREFOOT WITHOUT AND WITH CONTRAST TECHNIQUE: Multiplanar, multisequence MR imaging of the left forefoot was performed both before and after administration of intravenous contrast. CONTRAST:  7.42mL GADAVIST GADOBUTROL 1 MMOL/ML IV SOLN COMPARISON:  Left foot x-rays from same day. FINDINGS: Bones/Joint/Cartilage Abnormal marrow edema and enhancement with corresponding decreased T1 marrow signal involving the fifth metatarsal head. Mild marrow edema and enhancement involving fifth proximal phalanx. Dorsal and medial subluxation of the fifth proximal phalanx with respect to the fifth metatarsal. No fracture. Large fifth MTP joint effusion. Ligaments Attenuation of the fifth MTP joint collateral ligaments, likely torn. Remaining collateral ligaments  are intact. Lisfranc ligament is intact. Muscles and Tendons Flexor and extensor tendons are intact. Small amount of fluid surrounding the fifth flexor tendon at level of the MTP joint. Soft tissue Dorsal and lateral forefoot soft tissue swelling. No fluid collection or hematoma. No soft tissue mass. IMPRESSION: 1. 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 No abscess. 2. Fifth MTP joint subluxation. 3. Mild fifth flexor tenosynovitis at level of the MTP joint. Electronically Signed   By: Titus Dubin M.D.   On: 12/08/2019 09:28   DG Foot Complete Left  Result Date: 12/08/2019 CLINICAL DATA:  Infection fifth digit pain and swelling EXAM: LEFT FOOT - COMPLETE 3+ VIEW COMPARISON:  None. FINDINGS: Medial subluxation base of fifth proximal phalanx with respect to the head of the metatarsal. Prominent soft tissue  swelling at the level of the MTP joint with probable soft tissue ulcer. Mild cortex deformity at the head of the fifth metatarsal on the lateral side. IMPRESSION: 1. Mild cortex deformity lateral aspect of the head of fifth metatarsal is indeterminate for cortical fracture versus early changes of osteomyelitis. Correlation with MRI is suggested. 2. Medial subluxation of the fifth proximal phalanx with respect to the head of the fifth metatarsal of unknown chronicity. Correlate for any history of recent trauma. Electronically Signed   By: Donavan Foil M.D.   On: 12/08/2019 00:24    Positive ROS: All other systems have been reviewed and were otherwise negative with the exception of those mentioned in the HPI and as above.  12 point ROS was performed.  Physical Exam: General: Alert and oriented.  No apparent distress.  Vascular:  Left foot:Dorsalis Pedis:  present Posterior Tibial:  present  Right foot: Dorsalis Pedis:  present Posterior Tibial:  present  Neuro:intact  Derm: There is noted  erythema to the lower aspect of the left fifth MTPJ.  There is a small 2 to 3 mm draining ulcerative site plantar lateral to the fifth MTPJ.  This is quite tender with attempted palpation and compression to the area.  Ortho/MS: Mild edema to the left foot surrounding the fifth MTPJ at this time.  She has full range of motion of the ankle subtalar midtarsal metatarsophalangeal joints but very guarded with any attempted fifth MTPJ range of motion.  I personally reviewed x-rays and MRI.  X-ray shows the dislocated fifth toe at the MTPJ.  The proximal phalanx is dislocated medial to the metatarsal head.  I do not see any obvious erosive changes to the joint at this time.  MRI is concerning for septic arthritis with osteomyelitis of the fifth metatarsal head and suspected early osteomyelitis of the fifth proximal phalanx.  MRI showed edema surrounding the fifth metatarsal head there is mention of mild cortex form in the on x-ray.  Assessment: Possible osteomyelitis left fifth MTPJ with septic arthritis fifth toe joint.  Plan: At this time we will plan for operative I&D and deep wound culture.  We will further evaluate the metatarsal head.  If there is any concern nerve cortical erosion can do bone biopsy at that time.  Clinically this is not severely inflamed but with the subluxated dislocated joint there is concern for worsening infection.  Had a long discussion with her in regards to this.  She understands the surgical plan at this time.  She also understands she is at risk of possible amputation of the fifth toe and metatarsal head if the infection continues.  Verbal consent has been given.  We will plan for surgery this afternoon.    Elesa Hacker, DPM Cell [phone]   12/08/2019 2:39 PM

## 2019-12-08 NOTE — Anesthesia Postprocedure Evaluation (Signed)
Anesthesia Post Note  Patient: Christine Schroeder  Procedure(s) Performed: IRRIGATION AND DEBRIDEMENT FOOT (Left )  Patient location during evaluation: PACU Anesthesia Type: General Level of consciousness: awake and alert Pain management: pain level controlled Vital Signs Assessment: post-procedure vital signs reviewed and stable Respiratory status: spontaneous breathing, nonlabored ventilation, respiratory function stable and patient connected to nasal cannula oxygen Cardiovascular status: blood pressure returned to baseline and stable Postop Assessment: no apparent nausea or vomiting Anesthetic complications: no   No complications documented.   Last Vitals:  Vitals:   12/08/19 1816 12/08/19 1847  BP: 103/60 104/67  Pulse: 93 86  Resp: 18 14  Temp:  36.7 C  SpO2: 99% 97%    Last Pain:  Vitals:   12/08/19 1847  TempSrc: Oral  PainSc: 0-No pain                 Martha Clan

## 2019-12-08 NOTE — ED Notes (Signed)
Provided pt with warm blankets. Pt with no other needs. Call bell within reach.

## 2019-12-08 NOTE — ED Notes (Signed)
Informed pt that she is NPO at this time until podiatry sees her. Pt verbalized understanding at this time.

## 2019-12-08 NOTE — ED Provider Notes (Signed)
[facility] Emergency Department Provider Note  ____________________________________________  Time seen: Approximately 12:18 AM  I have reviewed the triage vital signs and the nursing notes.   HISTORY  Chief Complaint Fever and Nausea   HPI Christine Schroeder is a 54 y.o. female presents for evaluation of fever. Patient reports that back in May 2021 she had an injection of steroids onto her left foot's bunion. Since then the area has become infected and has grown several different bacteria including MRSA. Patient has been on several courses of antibiotics. About a week ago she started having pain again and went back to her podiatrist in [place]. Another culture was sent which was again positive for an infection and patient was started on Cipro and Bactrim. Over the last 2 days patient has been having a fever as high as 101F and she contacted her podiatrist recommended her coming to the emergency room to rule out osteomyelitis or sepsis. Apparently an MRI as an outpatient has been ordered however patient's insurance has not approved that yet. Patient is also complaining of nausea however that started when patient was placed on Cipro. She denies chills or rigors, cough or congestion, chest pain or shortness of breath, abdominal pain, vomiting or diarrhea, dysuria or hematuria.  Past Medical History:  Diagnosis Date  . Allergic rhinitis   . Chickenpox   . Family history of adverse reaction to anesthesia    Mother - PONV  . GERD (gastroesophageal reflux disease)   . History of MRSA infection 09/2019   MRSA infection of foot after cortisol injection.  . Increased BMI     Patient Active Problem List   Diagnosis Date Noted  . History of MRSA infection 09/2019  . Visual changes 01/07/2019  . BPV (benign positional vertigo) 02/22/2018  . Vasomotor symptoms due to menopause 09/14/2016  . Preventative health care 03/27/2015  . Borderline diabetes 03/27/2015  .  Palpitations 02/04/2015  . Allergic rhinitis 02/04/2015  . GERD (gastroesophageal reflux disease) 02/04/2015    Past Surgical History:  Procedure Laterality Date  . COLONOSCOPY WITH PROPOFOL N/A 11/03/2017   Procedure: COLONOSCOPY WITH PROPOFOL;  Surgeon: Jonathon Bellows, MD;  Location: [facility] ENDOSCOPY;  Service: Endoscopy;  Laterality: N/A;  . NO PAST SURGERIES    . SHOULDER ARTHROSCOPY Right 02/10/2016   Procedure: Limited arthroscopic debridement, arthroscopic SLAP tear, subacromial decompression, mini-open rotator cuff repair, and mini-open biceps tenodesis, right shoulder;  Surgeon: Corky Mull, MD;  Location: [facility];  Service: Orthopedics;  Laterality: Right;    Prior to Admission medications   Medication Sig Start Date End Date Taking? Authorizing Provider  CALCIUM PO Take by mouth.    [provider]  cetirizine (ZYRTEC) 10 MG tablet Take 10 mg by mouth daily.     [provider]  Cholecalciferol (VITAMIN D PO) Take by mouth daily.    [provider]  diphenhydrAMINE (BENADRYL) 25 mg capsule Take 25 mg by mouth daily.     [provider]  esomeprazole (NEXIUM) 20 MG capsule Take 20 mg by mouth daily at 12 noon.    [provider]  estrogens, conjugated, (PREMARIN) 0.625 MG tablet Take 1 tablet (0.625 mg total) by mouth daily. 10/23/19   Rubie Maid, MD  LYSINE PO Take by mouth daily.    [provider]  MAGNESIUM PO Take by mouth daily.    [provider]  medroxyPROGESTERone (PROVERA) 2.5 MG tablet Take 1 tablet (2.5 mg total) by mouth daily. 10/23/19  Rubie Maid, MD  Multiple Vitamin (MULTIVITAMIN) tablet Take 1 tablet by mouth daily.    [provider]    Allergies Etodolac, Meloxicam, Penicillins, and Prednisone  Family History  Problem Relation Age of Onset  . Hypertension Mother   . Heart disease Maternal Uncle   . Stroke Maternal Grandmother   . Diabetes Maternal Grandmother   .  Diabetes Maternal Grandfather   . Colon cancer Maternal Aunt   . Cancer Neg Hx     Social History Social History   Tobacco Use  . Smoking status: Never Smoker  . Smokeless tobacco: Never Used  Vaping Use  . Vaping Use: Never used  Substance Use Topics  . Alcohol use: Not Currently    Alcohol/week: 0.0 standard drinks    Comment: Holidays  . Drug use: No    Review of Systems  Constitutional: + fever. Eyes: Negative for visual changes. ENT: Negative for sore throat. Neck: No neck pain  Cardiovascular: Negative for chest pain. Respiratory: Negative for shortness of breath. Gastrointestinal: Negative for abdominal pain, vomiting or diarrhea. + nausea Genitourinary: Negative for dysuria. Musculoskeletal: Negative for back pain. + L foot pain Skin: Negative for rash. Neurological: Negative for headaches, weakness or numbness. Psych: No SI or HI  ____________________________________________   PHYSICAL EXAM:  VITAL SIGNS: Vitals:   12/08/19 0300 12/08/19 0645  BP: 118/65 120/74  Pulse: 68 87  Resp: 18 18  Temp:    SpO2: 99% 99%    Constitutional: Alert and oriented. Well appearing and in no apparent distress. HEENT:      Head: Normocephalic and atraumatic.         Eyes: Conjunctivae are normal. Sclera is non-icteric.       Mouth/Throat: Mucous membranes are moist.       Neck: Supple with no signs of meningismus. Cardiovascular: Regular rate and rhythm. No murmurs, gallops, or rubs.  Respiratory: Normal respiratory effort. Lungs are clear to auscultation bilaterally.  Gastrointestinal: Soft, non tender. Musculoskeletal: There is minimal erythema located at the site of prior infection which is lateral to the 5th toe on the L with no warmth, crepitus, fluctuance. Patient is tender to palpation on that area.  No edema, cyanosis, or erythema of extremities. Neurologic: Normal speech and language. Face is symmetric. Moving all extremities. No gross focal neurologic  deficits are appreciated. Skin: Skin is warm, dry and intact. No rash noted. Psychiatric: Mood and affect are normal. Speech and behavior are normal.  ____________________________________________   LABS (all labs ordered are listed, but only abnormal results are displayed)  Labs Reviewed  COMPREHENSIVE METABOLIC PANEL - Abnormal; Notable for the following components:      Result Value   Sodium 134 (*)    Glucose, Bld 122 (*)    Alkaline Phosphatase 26 (*)    All other components within normal limits  CULTURE, BLOOD (ROUTINE X 2)  CULTURE, BLOOD (ROUTINE X 2)  SARS CORONAVIRUS 2 BY RT PCR (HOSPITAL ORDER, [facility])  CBC WITH DIFFERENTIAL/PLATELET  LACTIC ACID, PLASMA   ____________________________________________  EKG  none  ____________________________________________  RADIOLOGY  I have personally reviewed the images performed during this visit and I agree with the Radiologist's read.   Interpretation by Radiologist:  DG Foot Complete Left  Result Date: 12/08/2019 CLINICAL DATA:  Infection fifth digit pain and swelling EXAM: LEFT FOOT - COMPLETE 3+ VIEW COMPARISON:  None. FINDINGS: Medial subluxation base of fifth proximal phalanx with respect to the head of  the metatarsal. Prominent soft tissue swelling at the level of the MTP joint with probable soft tissue ulcer. Mild cortex deformity at the head of the fifth metatarsal on the lateral side. IMPRESSION: 1. Mild cortex deformity lateral aspect of the head of fifth metatarsal is indeterminate for cortical fracture versus early changes of osteomyelitis. Correlation with MRI is suggested. 2. Medial subluxation of the fifth proximal phalanx with respect to the head of the fifth metatarsal of unknown chronicity. Correlate for any history of recent trauma. Electronically Signed   By: Donavan Foil M.D.   On: 12/08/2019 00:24       ____________________________________________   PROCEDURES  Procedure(s) performed:yes .1-3 Lead EKG Interpretation Performed by: Rudene Re, MD Authorized by: Rudene Re, MD     Interpretation: normal     ECG rate assessment: normal     Rhythm: sinus rhythm     Ectopy: none     Critical Care performed: yes  CRITICAL CARE Performed by: Rudene Re  ?  Total critical care time: 35 min  Critical care time was exclusive of separately billable procedures and treating other patients.  Critical care was necessary to treat or prevent imminent or life-threatening deterioration.  Critical care was time spent personally by me on the following activities: development of treatment plan with patient and/or surrogate as well as nursing, discussions with consultants, evaluation of patient's response to treatment, examination of patient, obtaining history from patient or surrogate, ordering and performing treatments and interventions, ordering and review of laboratory studies, ordering and review of radiographic studies, pulse oximetry and re-evaluation of patient's condition.  ____________________________________________   INITIAL IMPRESSION / ASSESSMENT AND PLAN / ED COURSE  54 y.o. female presents for evaluation of fever. Patient currently on Cipro and Bactrim for a foot infection that she has been dealing with for the last 3 months after receiving a steroid injection on the foot. Now with 2 days of fever. Unfortunately I am unable to see her podiatrist's notes or results of imaging and cultures done at the office. Here she is afebrile, she denies take anything for fever at home, she is not tachycardic, not tachypneic with normal blood pressure. The foot is slightly red but no warmth with no crepitus or fluctuance, she is tender to palpation in that area. Patient has been trying to get an MRI as an outpatient however has not received approval from the insurance yet. We  will start with an x-ray to look for any signs of osteomyelitis. Her blood work shows no leukocytosis. At this time no signs of sepsis. We will send cultures to rule out bacteremia and check a lactic. If x-ray is unremarkable we will get the MRI of her foot here.  _________________________ 7:01 AM on 12/08/2019 -----------------------------------------  MRI confirms osteomyelitis of the foot.  Since patient has been on several oral antibiotics at home will admit for IV antibiotics.  Order IV Rocephin and vancomycin.    _____________________________________________ Please note:  Patient was evaluated in Emergency Department today for the symptoms described in the history of present illness. Patient was evaluated in the context of the global COVID-19 pandemic, which necessitated consideration that the patient might be at risk for infection with the SARS-CoV-2 virus that causes COVID-19. Institutional protocols and algorithms that pertain to the evaluation of patients at risk for COVID-19 are in a state of rapid change based on information released by regulatory bodies including the CDC and federal and state organizations. These policies and algorithms were followed during the  patient's care in the ED.  Some ED evaluations and interventions may be delayed as a result of limited staffing during the pandemic.   [place] Controlled Substance Database was reviewed by me. ____________________________________________   FINAL CLINICAL IMPRESSION(S) / ED DIAGNOSES   Final diagnoses:  Other acute osteomyelitis of left foot ([place])      NEW MEDICATIONS STARTED DURING THIS VISIT:  ED Discharge Orders    None       Note:  This document was prepared using Dragon voice recognition software and may include unintentional dictation errors.    Alfred Levins, [place], MD 12/08/19 [phone]

## 2019-12-08 NOTE — H&P (Signed)
[facility] NAME: Christine Schroeder    MR#:  [id number]  DATE OF BIRTH:  [birth year]  DATE OF ADMISSION:  12/07/2019  PRIMARY CARE PHYSICIAN: Pleas Koch, NP   REQUESTING/REFERRING PHYSICIAN: Dr Alfred Levins  Patient coming from : home with husband   CHIEF COMPLAINT:  fever 101 left fifth toe pain.  HISTORY OF PRESENT ILLNESS:  Christine Schroeder  is a 54 y.o. female with a known history of M RSA infection of left foot after cortisone injection, hormone replacement therapy for menopausal symptoms, allergic rhinitis comes to the emergency room with above symptoms.  Patient reports back in May 2021 she had injection of steroid on her left fifth toe bunion. Since then she has had infection with different bacteria including  M RSA. She has been on several course of antibiotic. She wears ortho boot. She follows with podiatrist in [place] who has prescribed her Cipro and Bactrim since last week.  ED course: fever 99, pulse 87 blood pressure 120/74. Sats 99 on room air white count 5.6, lactic acid 1.3 MRI left foot per ER physician confirms osteomyelitis she started on IV vancomycin and Rocephin  Podiatry Dr. Vickki Muff to see patient. PAST MEDICAL HISTORY:   Past Medical History:  Diagnosis Date  . Allergic rhinitis   . Chickenpox   . Family history of adverse reaction to anesthesia    Mother - PONV  . GERD (gastroesophageal reflux disease)   . History of MRSA infection 09/2019   MRSA infection of foot after cortisol injection.  . Increased BMI     PAST SURGICAL HISTOIRY:   Past Surgical History:  Procedure Laterality Date  . COLONOSCOPY WITH PROPOFOL N/A 11/03/2017   Procedure: COLONOSCOPY WITH PROPOFOL;  Surgeon: Jonathon Bellows, MD;  Location: [facility] ENDOSCOPY;  Service: Endoscopy;  Laterality: N/A;  . NO PAST SURGERIES    . SHOULDER ARTHROSCOPY Right 02/10/2016   Procedure: Limited arthroscopic debridement, arthroscopic SLAP  tear, subacromial decompression, mini-open rotator cuff repair, and mini-open biceps tenodesis, right shoulder;  Surgeon: Corky Mull, MD;  Location: [facility];  Service: Orthopedics;  Laterality: Right;    SOCIAL HISTORY:   Social History   Tobacco Use  . Smoking status: Never Smoker  . Smokeless tobacco: Never Used  Substance Use Topics  . Alcohol use: Not Currently    Alcohol/week: 0.0 standard drinks    Comment: Holidays    FAMILY HISTORY:   Family History  Problem Relation Age of Onset  . Hypertension Mother   . Heart disease Maternal Uncle   . Stroke Maternal Grandmother   . Diabetes Maternal Grandmother   . Diabetes Maternal Grandfather   . Colon cancer Maternal Aunt   . Cancer Neg Hx     DRUG ALLERGIES:   Allergies  Allergen Reactions  . Etodolac Other (See Comments)    Abdominal pain  . Meloxicam Other (See Comments)    Abdominal pain  . Penicillins Hives  . Prednisone Rash    Abdominal pain    REVIEW OF SYSTEMS:  Review of Systems  Constitutional: Positive for fever. Negative for chills and weight loss.  HENT: Negative for ear discharge, ear pain and nosebleeds.   Eyes: Negative for blurred vision, pain and discharge.  Respiratory: Negative for sputum production, shortness of breath, wheezing and stridor.   Cardiovascular: Negative for chest pain, palpitations, orthopnea and PND.  Gastrointestinal: Negative for abdominal pain, diarrhea, nausea and vomiting.  Genitourinary: Negative for frequency and  urgency.  Musculoskeletal: Positive for joint pain. Negative for back pain.  Neurological: Negative for sensory change, speech change, focal weakness and weakness.  Psychiatric/Behavioral: Negative for depression and hallucinations. The patient is not nervous/anxious.      MEDICATIONS AT HOME:   Prior to Admission medications   Medication Sig Start Date End Date Taking? Authorizing Provider  CALCIUM PO Take by mouth.    [provider]  cetirizine (ZYRTEC) 10 MG tablet Take 10 mg by mouth daily.     [provider]  Cholecalciferol (VITAMIN D PO) Take by mouth daily.    [provider]  diphenhydrAMINE (BENADRYL) 25 mg capsule Take 25 mg by mouth daily.     [provider]  esomeprazole (NEXIUM) 20 MG capsule Take 20 mg by mouth daily at 12 noon.    [provider]  estrogens, conjugated, (PREMARIN) 0.625 MG tablet Take 1 tablet (0.625 mg total) by mouth daily. 10/23/19   Rubie Maid, MD  LYSINE PO Take by mouth daily.    [provider]  MAGNESIUM PO Take by mouth daily.    [provider]  medroxyPROGESTERone (PROVERA) 2.5 MG tablet Take 1 tablet (2.5 mg total) by mouth daily. 10/23/19   Rubie Maid, MD  Multiple Vitamin (MULTIVITAMIN) tablet Take 1 tablet by mouth daily.    [provider]      VITAL SIGNS:  Blood pressure 120/74, pulse 87, temperature 99.8 F (37.7 C), resp. rate 18, height 5\' 2"  (1.575 m), weight 70.8 kg, last menstrual period 07/12/2016, SpO2 99 %.  PHYSICAL EXAMINATION:  GENERAL:  53 y.o.-year-old patient lying in the bed with no acute distress.  EYES: Pupils equal, round, reactive to light and accommodation. No scleral icterus.  HEENT: Head atraumatic, normocephalic. Oropharynx and nasopharynx clear.  NECK:  Supple, no jugular venous distention. No thyroid enlargement, no tenderness.  LUNGS: Normal breath sounds bilaterally, no wheezing, rales,rhonchi or crepitation. No use of accessory muscles of respiration.  CARDIOVASCULAR: S1, S2 normal. No murmurs, rubs, or gallops.  ABDOMEN: Soft, nontender, nondistended. Bowel sounds present. No organomegaly or mass.  EXTREMITIES: left foot today    NEUROLOGIC: Cranial nerves II through XII are intact. Muscle strength 5/5 in all extremities. Sensation intact. Gait not checked.  PSYCHIATRIC: The patient is alert and oriented x 3.  SKIN: No obvious rash, lesion, or ulcer.   LABORATORY  PANEL:   CBC Recent Labs  Lab 12/07/19 1930  WBC 5.6  HGB 14.2  HCT 40.4  PLT 238   ------------------------------------------------------------------------------------------------------------------  Chemistries  Recent Labs  Lab 12/07/19 1930  NA 134*  K 3.5  CL 98  CO2 25  GLUCOSE 122*  BUN 13  CREATININE 0.89  CALCIUM 9.2  AST 27  ALT 23  ALKPHOS 26*  BILITOT 0.6   ------------------------------------------------------------------------------------------------------------------  Cardiac Enzymes No results for input(s): TROPONINI in the last 168 hours. ------------------------------------------------------------------------------------------------------------------  RADIOLOGY:  DG Foot Complete Left  Result Date: 12/08/2019 CLINICAL DATA:  Infection fifth digit pain and swelling EXAM: LEFT FOOT - COMPLETE 3+ VIEW COMPARISON:  None. FINDINGS: Medial subluxation base of fifth proximal phalanx with respect to the head of the metatarsal. Prominent soft tissue swelling at the level of the MTP joint with probable soft tissue ulcer. Mild cortex deformity at the head of the fifth metatarsal on the lateral side. IMPRESSION: 1. Mild cortex deformity lateral aspect of the head of fifth metatarsal is indeterminate for cortical fracture versus early changes of osteomyelitis. Correlation with MRI is  suggested. 2. Medial subluxation of the fifth proximal phalanx with respect to the head of the fifth metatarsal of unknown chronicity. Correlate for any history of recent trauma. Electronically Signed   By: Donavan Foil M.D.   On: 12/08/2019 00:24    EKG:    IMPRESSION AND PLAN:   Christine Schroeder  is a 54 y.o. female with a known history of M RSA infection of left foot after cortisone injection, hormone replacement therapy for menopausal symptoms, allergic rhinitis comes to the emergency room with above symptoms.  1. left fifth metatarsal osteomyelitis -admit to medical floor -IV  vancomycin and Rocephin-- pharmacy to dose -IV PRN PO pain meds -podiatry consultation with Dr. Vickki Muff-- recommends keep patient NPO for now  2. hormone replacement therapy-- menopausal symptoms  3. DVT prophylaxis SCD for now in anticipation for possible surgery   Family Communication : husband in the ER Consults : podiatry Code Status : full DVT prophylaxis : SCD admission status inpatient  TOTAL TIME TAKING CARE OF THIS PATIENT: 45 minutes.    Fritzi Mandes M.D  [facility]     CC: Primary care physician; Pleas Koch, NP

## 2019-12-09 ENCOUNTER — Encounter: Payer: Self-pay | Admitting: Podiatry

## 2019-12-09 LAB — BASIC METABOLIC PANEL
Anion gap: 6 (ref 5–15)
BUN: 8 mg/dL (ref 6–20)
CO2: 25 mmol/L (ref 22–32)
Calcium: 8.7 mg/dL — ABNORMAL LOW (ref 8.9–10.3)
Chloride: 107 mmol/L (ref 98–111)
Creatinine, Ser: 0.6 mg/dL (ref 0.44–1.00)
GFR calc Af Amer: >60 mL/min (ref >60)
GFR calc non Af Amer: >60 mL/min (ref >60)
Glucose, Bld: 138 mg/dL — ABNORMAL HIGH (ref 70–99)
Potassium: 4.1 mmol/L (ref 3.5–5.1)
Sodium: 138 mmol/L (ref 135–145)

## 2019-12-09 LAB — SURGICAL PCR SCREEN
MRSA, PCR: NEGATIVE
Staphylococcus aureus: NEGATIVE

## 2019-12-09 LAB — SEDIMENTATION RATE: Sed Rate: 17 mm/hr (ref 0–30)

## 2019-12-09 LAB — HIV ANTIBODY (ROUTINE TESTING W REFLEX): HIV Screen 4th Generation wRfx: NONREACTIVE

## 2019-12-09 MED ORDER — LORATADINE 10 MG PO TABS
10.0000 mg | ORAL_TABLET | Freq: Every day | ORAL | Status: DC
  Administered 2019-12-09 – 2019-12-10 (×2): 10 mg via ORAL
  Filled 2019-12-09 (×2): qty 1

## 2019-12-09 MED ORDER — MEDROXYPROGESTERONE ACETATE 2.5 MG PO TABS
2.5000 mg | ORAL_TABLET | Freq: Every day | ORAL | Status: DC
  Administered 2019-12-09 – 2019-12-10 (×2): 2.5 mg via ORAL
  Filled 2019-12-09 (×2): qty 1

## 2019-12-09 MED ORDER — ESTROGENS CONJUGATED 0.625 MG PO TABS
0.6250 mg | ORAL_TABLET | Freq: Every day | ORAL | Status: DC
  Administered 2019-12-09 – 2019-12-10 (×2): 0.625 mg via ORAL
  Filled 2019-12-09 (×2): qty 1

## 2019-12-09 MED ORDER — MUPIROCIN 2 % EX OINT
1.0000 "application " | TOPICAL_OINTMENT | Freq: Two times a day (BID) | CUTANEOUS | Status: DC
  Filled 2019-12-09: qty 22

## 2019-12-09 MED ORDER — PANTOPRAZOLE SODIUM 40 MG PO TBEC
40.0000 mg | DELAYED_RELEASE_TABLET | Freq: Every day | ORAL | Status: DC
  Administered 2019-12-09 – 2019-12-10 (×2): 40 mg via ORAL
  Filled 2019-12-09 (×2): qty 1

## 2019-12-09 NOTE — Consult Note (Addendum)
Pharmacy Antibiotic Note  Christine Schroeder is a 54 y.o. female admitted on 12/07/2019 with osteomyelitis of the left foot. Pt was prescribed bactrim and cipro from podiatrist last week. Pharmacy has been consulted for vancomycin dosing. Pt received vancomycin 1 g in ED and additional 750 mg IV x 1 dose for adequate load.   Day 2 IV abx, WBC wnl, no new fevers, and renal function remains stable. MRI: Fifth MTP joint septic arthritis with osteomyelitis. No abscess  Plan: Continue Vancomycin 1000 mg IV every 12 hours.   Goal trough 15-20 mcg/mL.  Monitor Scr daily and plan for levels per protocol.   Height: 5\' 2"  (157.5 cm) Weight: 70.8 kg (156 lb) IBW/kg (Calculated) : 50.1  Temp (24hrs), Avg:98.1 F (36.7 C), Min:98 F (36.7 C), Max:98.2 F (36.8 C)  Recent Labs  Lab 12/07/19 1930 12/08/19 0001 12/09/19 0418  WBC 5.6  --   --   CREATININE 0.89  --  0.60  LATICACIDVEN  --  1.3  --     Estimated Creatinine Clearance: 75 mL/min (by C-G formula based on SCr of 0.6 mg/dL).    Allergies  Allergen Reactions  . Penicillins Hives  . Etodolac Other (See Comments)    Abdominal pain  . Meloxicam Other (See Comments)    Abdominal pain  . Prednisone Rash    Abdominal pain    Antimicrobials this admission: 8/29 CTX >>  8/29 vancomycin >>   Microbiology results: 8/29 BCx: NGTD 8/29: Wcx: pending  Thank you for allowing pharmacy to be a part of this patient's care.  Sherilyn Banker, PharmD Pharmacy Resident  12/09/2019 8:31 AM

## 2019-12-09 NOTE — Progress Notes (Signed)
PROGRESS NOTE    Christine Schroeder  NGE:[id number] DOB: [birth year] DOA: 12/07/2019 PCP: Pleas Koch, NP   Chief complaint.  Left foot pain.  Brief Narrative: Christine Schroeder  is a 54 y.o. female with a known history of M RSA infection of left foot after cortisone injection, hormone replacement therapy for menopausal symptoms, allergic rhinitis comes to the emergency room with left foot pain. Patient reports back in May 2021 she had injection of steroid on her left fifth toe bunion, which caused subsequent infections.  MRI of the left foot showed Fifth MTP joint septic arthritis with osteomyelitis, I&D was performed on 8/29.  Patient treated with Rocephin and vancomycin pending culture results.     Assessment & Plan:   Active Problems:   Left foot infection   Osteomyelitis ([place])  #1. Left foot fifth MTP joint septic arthritis with osteomyelitis. Status post I&D.  Pending culture results from blood and drain.  Currently on vancomycin and Rocephin, will continue.  2.  Hormone replacement therapy.  Restart home medicines.   DVT prophylaxis: SCDs Code Status: Full Family Communication: None Disposition Plan:  . Patient came from: Home           . Anticipated d/c place: Home . Barriers to d/c OR conditions which need to be met to effect a safe d/c:   Consultants:   Podiatry.  Procedures: I&D Antimicrobials:  Rocephin and vancomycin.  Subjective: Patient doing well, pain under control.  Denies any short of breath or cough.  No abdominal pain or nausea vomiting.  Objective: Vitals:   12/08/19 2044 12/09/19 0007 12/09/19 0434 12/09/19 0843  BP: 104/61 98/64 (!) 102/59 104/64  Pulse: 73 75 68 (!) 58  Resp: 20 20 20 18   Temp: 98.2 F (36.8 C) 98 F (36.7 C) 98.1 F (36.7 C) 97.8 F (36.6 C)  TempSrc: Oral Oral Oral Oral  SpO2: 97% 96% 99%   Weight:      Height:        Intake/Output Summary (Last 24 hours) at 12/09/2019 0857 Last data filed at 12/09/2019  4010 Gross per 24 hour  Intake 1172.49 ml  Output --  Net 1172.49 ml   Filed Weights   12/07/19 1927  Weight: 70.8 kg    Examination:  General exam: Appears calm and comfortable  Respiratory system: Clear to auscultation. Respiratory effort normal. Cardiovascular system: S1 & S2 heard, RRR. No JVD, murmurs, rubs, gallops or clicks. No pedal edema. Gastrointestinal system: Abdomen is nondistended, soft and nontender. No organomegaly or masses felt. Normal bowel sounds heard. Central nervous system: Alert and oriented. No focal neurological deficits. Extremities: Left foot ulcer Skin: No rashes, lesions or ulcers Psychiatry: Judgement and insight appear normal. Mood & affect appropriate.     Data Reviewed: I have personally reviewed following labs and imaging studies  CBC: Recent Labs  Lab 12/07/19 1930  WBC 5.6  NEUTROABS 4.2  HGB 14.2  HCT 40.4  MCV 86.5  PLT 272   Basic Metabolic Panel: Recent Labs  Lab 12/07/19 1930 12/09/19 0418  NA 134* 138  K 3.5 4.1  CL 98 107  CO2 25 25  GLUCOSE 122* 138*  BUN 13 8  CREATININE 0.89 0.60  CALCIUM 9.2 8.7*   GFR: Estimated Creatinine Clearance: 75 mL/min (by C-G formula based on SCr of 0.6 mg/dL). Liver Function Tests: Recent Labs  Lab 12/07/19 1930  AST 27  ALT 23  ALKPHOS 26*  BILITOT 0.6  PROT 7.7  ALBUMIN 4.2  No results for input(s): LIPASE, AMYLASE in the last 168 hours. No results for input(s): AMMONIA in the last 168 hours. Coagulation Profile: No results for input(s): INR, PROTIME in the last 168 hours. Cardiac Enzymes: No results for input(s): CKTOTAL, CKMB, CKMBINDEX, TROPONINI in the last 168 hours. BNP (last 3 results) No results for input(s): PROBNP in the last 8760 hours. HbA1C: No results for input(s): HGBA1C in the last 72 hours. CBG: No results for input(s): GLUCAP in the last 168 hours. Lipid Profile: No results for input(s): CHOL, HDL, LDLCALC, TRIG, CHOLHDL, LDLDIRECT in the last  72 hours. Thyroid Function Tests: No results for input(s): TSH, T4TOTAL, FREET4, T3FREE, THYROIDAB in the last 72 hours. Anemia Panel: No results for input(s): VITAMINB12, FOLATE, FERRITIN, TIBC, IRON, RETICCTPCT in the last 72 hours. Sepsis Labs: Recent Labs  Lab 12/08/19 0001  LATICACIDVEN 1.3    Recent Results (from the past 240 hour(s))  Blood culture (routine x 2)     Status: None (Preliminary result)   Collection Time: 12/08/19 12:04 AM   Specimen: BLOOD  Result Value Ref Range Status   Specimen Description BLOOD RIGHT ANTECUBITAL  Final   Special Requests   Final    BOTTLES DRAWN AEROBIC AND ANAEROBIC Blood Culture adequate volume   Culture   Final    NO GROWTH 1 DAY Performed at [facility], [address] [place], [place] [address]    Report Status PENDING  Incomplete  Blood culture (routine x 2)     Status: None (Preliminary result)   Collection Time: 12/08/19 12:38 AM   Specimen: BLOOD  Result Value Ref Range Status   Specimen Description BLOOD LEFT ANTECUBITAL  Final   Special Requests   Final    BOTTLES DRAWN AEROBIC AND ANAEROBIC Blood Culture results may not be optimal due to an inadequate volume of blood received in culture bottles   Culture   Final    NO GROWTH 1 DAY Performed at [facility], [address], [place], [place] [address]    Report Status PENDING  Incomplete  SARS Coronavirus 2 by RT PCR (hospital order, performed in [facility] hospital lab) Nasopharyngeal Nasopharyngeal Swab     Status: None   Collection Time: 12/08/19  7:19 AM   Specimen: Nasopharyngeal Swab  Result Value Ref Range Status   SARS Coronavirus 2 NEGATIVE NEGATIVE Final    Comment: (NOTE) SARS-CoV-2 target nucleic acids are NOT DETECTED.  The SARS-CoV-2 RNA is generally detectable in upper and lower respiratory specimens during the acute phase of infection. The lowest concentration of SARS-CoV-2 viral copies this assay can detect is 250 copies / mL. A  negative result does not preclude SARS-CoV-2 infection and should not be used as the sole basis for treatment or other patient management decisions.  A negative result may occur with improper specimen collection / handling, submission of specimen other than nasopharyngeal swab, presence of viral mutation(s) within the areas targeted by this assay, and inadequate number of viral copies (<250 copies / mL). A negative result must be combined with clinical observations, patient history, and epidemiological information.  Fact Sheet for Patients:   StrictlyIdeas.no  Fact Sheet for Healthcare Providers: BankingDealers.co.za  This test is not yet approved or  cleared by the [place] FDA and has been authorized for detection and/or diagnosis of SARS-CoV-2 by FDA under an Emergency Use Authorization (EUA).  This EUA will remain in effect (meaning this test can be used) for the duration of the COVID-19  declaration under Section 564(b)(1) of the Act, 21 U.S.C. section 360bbb-3(b)(1), unless the authorization is terminated or revoked sooner.  Performed at [facility], [address] [place], [place] [address]   Aerobic/Anaerobic Culture (surgical/deep wound)     Status: None (Preliminary result)   Collection Time: 12/08/19  5:35 PM   Specimen: Foot, Left; Wound  Result Value Ref Range Status   Specimen Description   Final    FOOT LEFT Performed at [facility], [address] [place], [place] [address]    Special Requests   Final    ULCER FIFTH TOE JOINT Performed at [facility], [place] [address], [place], [place] [address]    Gram Stain PENDING  Incomplete   Culture PENDING  Incomplete   Report Status PENDING  Incomplete         Radiology Studies: MR FOOT LEFT W WO CONTRAST  Result Date: 12/08/2019 CLINICAL DATA:  Left foot wound near the fifth MTP joint. EXAM: MRI OF THE LEFT FOREFOOT WITHOUT AND  WITH CONTRAST TECHNIQUE: Multiplanar, multisequence MR imaging of the left forefoot was performed both before and after administration of intravenous contrast. CONTRAST:  7.76m GADAVIST GADOBUTROL 1 MMOL/ML IV SOLN COMPARISON:  Left foot x-rays from same day. FINDINGS: Bones/Joint/Cartilage Abnormal marrow edema and enhancement with corresponding decreased T1 marrow signal involving the fifth metatarsal head. Mild marrow edema and enhancement involving fifth proximal phalanx. Dorsal and medial subluxation of the fifth proximal phalanx with respect to the fifth metatarsal. No fracture. Large fifth MTP joint effusion. Ligaments Attenuation of the fifth MTP joint collateral ligaments, likely torn. Remaining collateral ligaments are intact. Lisfranc ligament is intact. Muscles and Tendons Flexor and extensor tendons are intact. Small amount of fluid surrounding the fifth flexor tendon at level of the MTP joint. Soft tissue Dorsal and lateral forefoot soft tissue swelling. No fluid collection or hematoma. No soft tissue mass. IMPRESSION: 1. 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 No abscess. 2. Fifth MTP joint subluxation. 3. Mild fifth flexor tenosynovitis at level of the MTP joint. Electronically Signed   By: WTitus DubinM.D.   On: 12/08/2019 09:28   DG Foot Complete Left  Result Date: 12/08/2019 CLINICAL DATA:  Infection fifth digit pain and swelling EXAM: LEFT FOOT - COMPLETE 3+ VIEW COMPARISON:  None. FINDINGS: Medial subluxation base of fifth proximal phalanx with respect to the head of the metatarsal. Prominent soft tissue swelling at the level of the MTP joint with probable soft tissue ulcer. Mild cortex deformity at the head of the fifth metatarsal on the lateral side. IMPRESSION: 1. Mild cortex deformity lateral aspect of the head of fifth metatarsal is indeterminate for cortical fracture versus early  changes of osteomyelitis. Correlation with MRI is suggested. 2. Medial subluxation of the fifth proximal phalanx with respect to the head of the fifth metatarsal of unknown chronicity. Correlate for any history of recent trauma. Electronically Signed   By: KDonavan FoilM.D.   On: 12/08/2019 00:24        Scheduled Meds: . estrogens (conjugated)  0.625 mg Oral Daily  . loratadine  10 mg Oral Daily  . medroxyPROGESTERone  2.5 mg Oral Daily  . pantoprazole  40 mg Oral Daily   Continuous Infusions: . sodium chloride 10 mL/hr at 12/09/19 0543  . cefTRIAXone (ROCEPHIN)  IV Stopped (12/09/19 0513)  . vancomycin Stopped (12/08/19 2223)     LOS:  1 day    Time spent: 23 minutes    Sharen Hones, MD [facility]   To contact the attending provider between 7A-7P or the covering provider during after hours 7P-7A, please log into the web site [email or web address] and access using universal  password for that web site. If you do not have the password, please call the hospital operator.  12/09/2019, 8:57 AM

## 2019-12-09 NOTE — Consult Note (Signed)
Infectious Disease     Reason for Consult:Foot infection    Referring Physician: Dr Vickki Muff Date of Admission:  12/07/2019   Active Problems:   Left foot infection   Osteomyelitis ([place])   HPI: Christine Schroeder is a 54 y.o. female relatively health admitted with fevers and worsening pain at site of know foot ulcer. Issues began in May after steroid injection as site of bunion. Has continued to bother her draining intermittently. Has been on and off abx. Cultures done at outside doctors office. She was told had MRSA. At time of admit was on cipro and bactrim. MRI on admit had concern for osteo. Went to OR 8/29 with dissection down to capsule with some synovitis noted. Wound debrided, flushed and closed.  ESR 17, WBC nml cx pending   Past Medical History:  Diagnosis Date  . Allergic rhinitis   . Chickenpox   . Family history of adverse reaction to anesthesia    Mother - PONV  . GERD (gastroesophageal reflux disease)   . History of MRSA infection 09/2019   MRSA infection of foot after cortisol injection.  . Increased BMI    Past Surgical History:  Procedure Laterality Date  . COLONOSCOPY WITH PROPOFOL N/A 11/03/2017   Procedure: COLONOSCOPY WITH PROPOFOL;  Surgeon: Jonathon Bellows, MD;  [facility] END[facility]COPY;  Service: Endoscopy;  Laterality: N/A;  . IRRIGATION AND DEBRIDEMENT FOOT Left 12/08/2019   Procedure: IRRIGATION AND DEBRIDEMENT FOOT;  Surgeon: Samara Deist, DPM;  Location: [facility] ORS;  Service: Podiatry;  Laterality: Left;  . NO PAST SURGERIES    . SHOULDER ARTHROSCOPY Right 02/10/2016   Procedure: Limited arthroscopic debridement, arthroscopic SLAP tear, subacromial decompression, mini-open rotator cuff repair, and mini-open biceps tenodesis, right shoulder;  Surgeon: Corky Mull, MD;  Location: [place];  Service: Orthopedics;  Laterality: Right;   Social History   Tobacco Use  . Smoking status: Never Smoker  . Smokeless tobacco: Never Used  Vaping Use  . Vaping  Use: Never used  Substance Use Topics  . Alcohol use: Not Currently    Alcohol/week: 0.0 standard drinks    Comment: Holidays  . Drug use: No   Family History  Problem Relation Age of Onset  . Hypertension Mother   . Heart disease Maternal Uncle   . Stroke Maternal Grandmother   . Diabetes Maternal Grandmother   . Diabetes Maternal Grandfather   . Colon cancer Maternal Aunt   . Cancer Neg Hx     Allergies:  Allergies  Allergen Reactions  . Penicillins Hives  . Etodolac Other (See Comments)    Abdominal pain  . Meloxicam Other (See Comments)    Abdominal pain  . Prednisone Rash    Abdominal pain    Current antibiotics: Antibiotics Given (last 72 hours)    Date/Time Action Medication Dose Rate   12/08/19 0715 New Bag/Given   cefTRIAXone (ROCEPHIN) 1 g in sodium chloride 0.9 % 100 mL IVPB 1 g 200 mL/hr   12/08/19 0749 New Bag/Given   vancomycin (VANCOCIN) IVPB 1000 mg/200 mL premix 1,000 mg 200 mL/hr   12/08/19 1015 New Bag/Given   vancomycin (VANCOREADY) IVPB 750 mg/150 mL 750 mg 150 mL/hr   12/08/19 2123 New Bag/Given   vancomycin (VANCOCIN) IVPB 1000 mg/200 mL premix 1,000 mg 200 mL/hr   12/09/19 0443 New Bag/Given   cefTRIAXone (ROCEPHIN) 1 g in sodium chloride 0.9 % 100 mL IVPB 1 g 200 mL/hr   12/09/19 1142 New Bag/Given   vancomycin (VANCOCIN) IVPB  1000 mg/200 mL premix 1,000 mg 200 mL/hr      MEDICATIONS: . estrogens (conjugated)  0.625 mg Oral Daily  . loratadine  10 mg Oral Daily  . medroxyPROGESTERone  2.5 mg Oral Daily  . pantoprazole  40 mg Oral Daily    Review of Systems - 11 systems reviewed and negative per HPI   OBJECTIVE: Temp:  [97.6 F (36.4 C)-98.2 F (36.8 C)] 97.6 F (36.4 C) (08/30 1151) Pulse Rate:  [50-93] 50 (08/30 1151) Resp:  [14-23] 18 (08/30 0843) BP: (90-113)/(56-67) 113/67 (08/30 1151) SpO2:  [96 %-100 %] 98 % (08/30 1151) Physical Exam  Constitutional:  oriented to person, place, and time. appears well-developed and  well-nourished. No distress.  HENT: [place]/AT, PERRLA, no scleral icterus Mouth/Throat: Oropharynx is clear and moist. No oropharyngeal exudate.  Cardiovascular: Normal rate, regular rhythm and normal heart sounds. Exam reveals no gallop and no friction rub.  No murmur heard.  Pulmonary/Chest: Effort normal and breath sounds normal. No respiratory distress.  has no wheezes.  Neck = supple, no nuchal rigidity Abdominal: Soft. Bowel sounds are normal.  exhibits no distension. There is no tenderness.  Lymphadenopathy: no cervical adenopathy. No axillary adenopathy Neurological: alert and oriented to person, place, and time.  Skin:L foot wrapped post op.    LABS: Results for orders placed or performed during the hospital encounter of 12/07/19 (from the past 48 hour(s))  CBC with Differential     Status: None   Collection Time: 12/07/19  7:30 PM  Result Value Ref Range   WBC 5.6 4.0 - 10.5 K/uL   RBC 4.67 3.87 - 5.11 MIL/uL   Hemoglobin 14.2 12.0 - 15.0 g/dL   HCT 40.4 36 - 46 %   MCV 86.5 80.0 - 100.0 fL   MCH 30.4 26.0 - 34.0 pg   MCHC 35.1 30.0 - 36.0 g/dL   RDW 12.5 11.5 - 15.5 %   Platelets 238 150 - 400 K/uL   nRBC 0.0 0.0 - 0.2 %   Neutrophils Relative % 74 %   Neutro Abs 4.2 1.7 - 7.7 K/uL   Lymphocytes Relative 16 %   Lymphs Abs 0.9 0.7 - 4.0 K/uL   Monocytes Relative 8 %   Monocytes Absolute 0.4 0 - 1 K/uL   Eosinophils Relative 2 %   Eosinophils Absolute 0.1 0 - 0 K/uL   Basophils Relative 0 %   Basophils Absolute 0.0 0 - 0 K/uL   Immature Granulocytes 0 %   Abs Immature Granulocytes 0.01 0.00 - 0.07 K/uL    Comment: Performed at [facility], [address] [place], [place] [address]  Comprehensive metabolic panel     Status: Abnormal   Collection Time: 12/07/19  7:30 PM  Result Value Ref Range   Sodium 134 (L) 135 - 145 mmol/L   Potassium 3.5 3.5 - 5.1 mmol/L   Chloride 98 98 - 111 mmol/L   CO2 25 22 - 32 mmol/L   Glucose, Bld 122 (H) 70 - 99 mg/dL     Comment: Glucose reference range applies only to samples taken after fasting for at least 8 hours.   BUN 13 6 - 20 mg/dL   Creatinine, Ser 0.89 0.44 - 1.00 mg/dL   Calcium 9.2 8.9 - 10.3 mg/dL   Total Protein 7.7 6.5 - 8.1 g/dL   Albumin 4.2 3.5 - 5.0 g/dL   AST 27 15 - 41 U/L   ALT 23 0 - 44 U/L   Alkaline Phosphatase  26 (L) 38 - 126 U/L   Total Bilirubin 0.6 0.3 - 1.2 mg/dL   GFR calc non Af Amer >60 >60 mL/min   GFR calc Af Amer >60 >60 mL/min   Anion gap 11 5 - 15    Comment: Performed at [facility], [address] [place], [place] [address]  Lactic acid, plasma     Status: None   Collection Time: 12/08/19 12:01 AM  Result Value Ref Range   Lactic Acid, Venous 1.3 0.5 - 1.9 mmol/L    Comment: Performed at [facility], [address] [place], [place] [address]  Blood culture (routine x 2)     Status: None (Preliminary result)   Collection Time: 12/08/19 12:04 AM   Specimen: BLOOD  Result Value Ref Range   Specimen Description BLOOD RIGHT ANTECUBITAL    Special Requests      BOTTLES DRAWN AEROBIC AND ANAEROBIC Blood Culture adequate volume   Culture      NO GROWTH 1 DAY Performed at [facility], [address] [place], [place] [address]    Report Status PENDING   Blood culture (routine x 2)     Status: None (Preliminary result)   Collection Time: 12/08/19 12:38 AM   Specimen: BLOOD  Result Value Ref Range   Specimen Description BLOOD LEFT ANTECUBITAL    Special Requests      BOTTLES DRAWN AEROBIC AND ANAEROBIC Blood Culture results may not be optimal due to an inadequate volume of blood received in culture bottles   Culture      NO GROWTH 1 DAY Performed at [facility], [address] [place], [place] [address]    Report Status PENDING   Surgical PCR screen     Status: None   Collection Time: 12/08/19  7:10 AM   Specimen: Nasal Mucosa; Nasal Swab  Result Value Ref Range   MRSA, PCR NEGATIVE NEGATIVE   Staphylococcus  aureus NEGATIVE NEGATIVE    Comment: (NOTE) The Xpert SA Assay (FDA approved for NASAL specimens in patients 22 years of age and older), is one component of a comprehensive surveillance program. It is not intended to diagnose infection nor to guide or monitor treatment. Performed at [facility], [address] [place],  [address]   SARS Coronavirus 2 by RT PCR (hospital order, performed in [facility] hospital lab) Nasopharyngeal Nasopharyngeal Swab     Status: None   Collection Time: 12/08/19  7:19 AM   Specimen: Nasopharyngeal Swab  Result Value Ref Range   SARS Coronavirus 2 NEGATIVE NEGATIVE    Comment: (NOTE) SARS-CoV-2 target nucleic acids are NOT DETECTED.  The SARS-CoV-2 RNA is generally detectable in upper and lower respiratory specimens during the acute phase of infection. The lowest concentration of SARS-CoV-2 viral copies this assay can detect is 250 copies / mL. A negative result does not preclude SARS-CoV-2 infection and should not be used as the sole basis for treatment or other patient management decisions.  A negative result may occur with improper specimen collection / handling, submission of specimen other than nasopharyngeal swab, presence of viral mutation(s) within the areas targeted by this assay, and inadequate number of viral copies (<250 copies / mL). A negative result must be combined with clinical observations, patient history, and epidemiological information.  Fact Sheet for Patients:   StrictlyIdeas.no  Fact Sheet for Healthcare Providers: BankingDealers.co.za  This test is not yet approved or  cleared by the [place] FDA and has been  authorized for detection and/or diagnosis of SARS-CoV-2 by FDA under an Emergency Use Authorization (EUA).  This EUA will remain in effect (meaning this test can be used) for the duration of the COVID-19 declaration under Section 564(b)(1) of the  Act, 21 U.S.C. section 360bbb-3(b)(1), unless the authorization is terminated or revoked sooner.  Performed at [facility], [address] [place], [place] [address]   Aerobic/Anaerobic Culture (surgical/deep wound)     Status: None (Preliminary result)   Collection Time: 12/08/19  5:26 PM   Specimen: Foot, Left; Wound  Result Value Ref Range   Specimen Description      TOE Performed at [facility], [address], [place], [place] [address]    Special Requests      LEFT 5TH TOE JOINT Performed at [facility], [address] [place], [place] [address]    Gram Stain PENDING    Culture      NO GROWTH < 24 HOURS Performed at [facility], [place] [address] [place], [place] [address]    Report Status PENDING   Aerobic/Anaerobic Culture (surgical/deep wound)     Status: None (Preliminary result)   Collection Time: 12/08/19  5:35 PM   Specimen: Foot, Left; Wound  Result Value Ref Range   Specimen Description      FOOT LEFT Performed at [facility], [address] [place], [place] [address]    Special Requests ULCER FIFTH TOE JOINT    Gram Stain PENDING    Culture      CULTURE REINCUBATED FOR BETTER GROWTH Performed at [facility], [place] [address] [place], [place] [address]    Report Status PENDING   HIV Antibody (routine testing w rflx)     Status: None   Collection Time: 12/09/19  4:18 AM  Result Value Ref Range   HIV Screen 4th Generation wRfx Non Reactive Non Reactive    Comment: Performed at [facility], [place] [address] [place], [place] [address]  ESR     Status: None   Collection Time: 12/09/19  4:18 AM  Result Value Ref Range   Sed Rate 17 0 - 30 mm/hr    Comment: Performed at [facility], [address] [place], [place] [address]  Basic metabolic panel     Status: Abnormal   Collection Time: 12/09/19  4:18 AM  Result Value Ref Range   Sodium 138 135 - 145 mmol/L   Potassium 4.1 3.5 -  5.1 mmol/L   Chloride 107 98 - 111 mmol/L   CO2 25 22 - 32 mmol/L   Glucose, Bld 138 (H) 70 - 99 mg/dL    Comment: Glucose reference range applies only to samples taken after fasting for at least 8 hours.   BUN 8 6 - 20 mg/dL   Creatinine, Ser 0.60 0.44 - 1.00 mg/dL   Calcium 8.7 (L) 8.9 - 10.3 mg/dL   GFR calc non Af Amer >60 >60 mL/min   GFR calc Af Amer >60 >60 mL/min   Anion gap 6 5 - 15    Comment: Performed at [facility], [address] [place], [place] [address]   No components found for: ESR, C REACTIVE PROTEIN MICRO: Recent Results (from the past 720 hour(s))  Blood culture (routine x 2)     Status: None (Preliminary result)   Collection Time: 12/08/19 12:04 AM   Specimen: BLOOD  Result Value Ref Range Status   Specimen Description BLOOD  RIGHT ANTECUBITAL  Final   Special Requests   Final    BOTTLES DRAWN AEROBIC AND ANAEROBIC Blood Culture adequate volume   Culture   Final    NO GROWTH 1 DAY Performed at [facility], [address] [place], [place] [address]    Report Status PENDING  Incomplete  Blood culture (routine x 2)     Status: None (Preliminary result)   Collection Time: 12/08/19 12:38 AM   Specimen: BLOOD  Result Value Ref Range Status   Specimen Description BLOOD LEFT ANTECUBITAL  Final   Special Requests   Final    BOTTLES DRAWN AEROBIC AND ANAEROBIC Blood Culture results may not be optimal due to an inadequate volume of blood received in culture bottles   Culture   Final    NO GROWTH 1 DAY Performed at [facility], [address], [place], [place] [address]    Report Status PENDING  Incomplete  Surgical PCR screen     Status: None   Collection Time: 12/08/19  7:10 AM   Specimen: Nasal Mucosa; Nasal Swab  Result Value Ref Range Status   MRSA, PCR NEGATIVE NEGATIVE Final   Staphylococcus aureus NEGATIVE NEGATIVE Final    Comment: (NOTE) The Xpert SA Assay (FDA approved for NASAL specimens in patients 48 years of  age and older), is one component of a comprehensive surveillance program. It is not intended to diagnose infection nor to guide or monitor treatment. Performed at [facility], [address] [place], [place] [address]   SARS Coronavirus 2 by RT PCR (hospital order, performed in [facility] hospital lab) Nasopharyngeal Nasopharyngeal Swab     Status: None   Collection Time: 12/08/19  7:19 AM   Specimen: Nasopharyngeal Swab  Result Value Ref Range Status   SARS Coronavirus 2 NEGATIVE NEGATIVE Final    Comment: (NOTE) SARS-CoV-2 target nucleic acids are NOT DETECTED.  The SARS-CoV-2 RNA is generally detectable in upper and lower respiratory specimens during the acute phase of infection. The lowest concentration of SARS-CoV-2 viral copies this assay can detect is 250 copies / mL. A negative result does not preclude SARS-CoV-2 infection and should not be used as the sole basis for treatment or other patient management decisions.  A negative result may occur with improper specimen collection / handling, submission of specimen other than nasopharyngeal swab, presence of viral mutation(s) within the areas targeted by this assay, and inadequate number of viral copies (<250 copies / mL). A negative result must be combined with clinical observations, patient history, and epidemiological information.  Fact Sheet for Patients:   StrictlyIdeas.no  Fact Sheet for Healthcare Providers: BankingDealers.co.za  This test is not yet approved or  cleared by the [place] FDA and has been authorized for detection and/or diagnosis of SARS-CoV-2 by FDA under an Emergency Use Authorization (EUA).  This EUA will remain in effect (meaning this test can be used) for the duration of the COVID-19 declaration under Section 564(b)(1) of the Act, 21 U.S.C. section 360bbb-3(b)(1), unless the authorization is terminated or revoked sooner.  Performed  at [facility], [address] [place], [place] [address]   Aerobic/Anaerobic Culture (surgical/deep wound)     Status: None (Preliminary result)   Collection Time: 12/08/19  5:26 PM   Specimen: Foot, Left; Wound  Result Value Ref Range Status   Specimen Description   Final    TOE Performed at [facility], [address] [place], [place] [address]  Special Requests   Final    LEFT 5TH TOE JOINT Performed at [facility], [address] [place], [place] [address]    Gram Stain PENDING  Incomplete   Culture   Final    NO GROWTH < 24 HOURS Performed at [facility], [place] [address], [place], [place] [address]    Report Status PENDING  Incomplete  Aerobic/Anaerobic Culture (surgical/deep wound)     Status: None (Preliminary result)   Collection Time: 12/08/19  5:35 PM   Specimen: Foot, Left; Wound  Result Value Ref Range Status   Specimen Description   Final    FOOT LEFT Performed at [facility], [address] [place], [place] [address]    Special Requests ULCER FIFTH TOE JOINT  Final   Gram Stain PENDING  Incomplete   Culture   Final    CULTURE REINCUBATED FOR BETTER GROWTH Performed at [facility], [place] [address], [place],  [address]    Report Status PENDING  Incomplete    IMAGING: MR FOOT LEFT W WO CONTRAST  Result Date: 12/08/2019 CLINICAL DATA:  Left foot wound near the fifth MTP joint. EXAM: MRI OF THE LEFT FOREFOOT WITHOUT AND WITH CONTRAST TECHNIQUE: Multiplanar, multisequence MR imaging of the left forefoot was performed both before and after administration of intravenous contrast. CONTRAST:  7.76m GADAVIST GADOBUTROL 1 MMOL/ML IV SOLN COMPARISON:  Left foot x-rays from same day. FINDINGS: Bones/Joint/Cartilage Abnormal marrow edema and enhancement with corresponding decreased T1 marrow signal involving the fifth metatarsal head. Mild marrow edema and enhancement involving fifth proximal phalanx. Dorsal  and medial subluxation of the fifth proximal phalanx with respect to the fifth metatarsal. No fracture. Large fifth MTP joint effusion. Ligaments Attenuation of the fifth MTP joint collateral ligaments, likely torn. Remaining collateral ligaments are intact. Lisfranc ligament is intact. Muscles and Tendons Flexor and extensor tendons are intact. Small amount of fluid surrounding the fifth flexor tendon at level of the MTP joint. Soft tissue Dorsal and lateral forefoot soft tissue swelling. No fluid collection or hematoma. No soft tissue mass. IMPRESSION: 1. 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 No abscess. 2. Fifth MTP joint subluxation. 3. Mild fifth flexor tenosynovitis at level of the MTP joint. Electronically Signed   By: WTitus DubinM.D.   On: 12/08/2019 09:28   DG Foot Complete Left  Result Date: 12/08/2019 CLINICAL DATA:  Infection fifth digit pain and swelling EXAM: LEFT FOOT - COMPLETE 3+ VIEW COMPARISON:  None. FINDINGS: Medial subluxation base of fifth proximal phalanx with respect to the head of the metatarsal. Prominent soft tissue swelling at the level of the MTP joint with probable soft tissue ulcer. Mild cortex deformity at the head of the fifth metatarsal on the lateral side. IMPRESSION: 1. Mild cortex deformity lateral aspect of the head of fifth metatarsal is indeterminate for cortical fracture versus early changes of osteomyelitis. Correlation with MRI is suggested. 2. Medial subluxation of the fifth proximal phalanx with respect to the head of the fifth metatarsal of unknown chronicity. Correlate for any history of recent trauma. Electronically Signed   By: KDonavan FoilM.D.   On: 12/08/2019 00:24    Assessment:   PGila Laufis a 54y.o. female healthy female admitted with infected L foot ulcer at site of prior injection for 5th MT bunion in May. She has been off and on abx.  Now s/p  debridement. No bony involvement. No Hx DM, PAD. She has been on Cipro and Bactrim since last week.    Recommendations Continue vancomycin and ceftriaxone pending culture results. Will call outside MD for culture results Given no obvious bony involvement can likely use oral regimen. Will need to take culture results with grain of salt as was already on oral cipro and bactrim.  Thank you very much for allowing me to participate in the care of this patient. Please call with questions.   Cheral Marker. Ola Spurr, MD

## 2019-12-09 NOTE — Progress Notes (Signed)
PODIATRY / FOOT AND ANKLE SURGERY PROGRESS NOTE  Reason for consult: L 5th met wound/infection  Chief Complaint: L foot infection   HPI: Christine Schroeder is a 54 y.o. female who presents status post 1 day left foot incision and drainage fifth metatarsal phalangeal joint level.  Patient doing well overall today with minimal pain.  Patient has maintained nonweightbearing status since the procedure.  Patient denies any subsequent further complaints overnight or today in general.  She states that her pain is well controlled.  PMHx:  Past Medical History:  Diagnosis Date  . Allergic rhinitis   . Chickenpox   . Family history of adverse reaction to anesthesia    Mother - PONV  . GERD (gastroesophageal reflux disease)   . History of MRSA infection 09/2019   MRSA infection of foot after cortisol injection.  . Increased BMI     Surgical Hx:  Past Surgical History:  Procedure Laterality Date  . COLONOSCOPY WITH PROPOFOL N/A 11/03/2017   Procedure: COLONOSCOPY WITH PROPOFOL;  Surgeon: Jonathon Bellows, MD;  Location: [facility] ENDOSCOPY;  Service: Endoscopy;  Laterality: N/A;  . IRRIGATION AND DEBRIDEMENT FOOT Left 12/08/2019   Procedure: IRRIGATION AND DEBRIDEMENT FOOT;  Surgeon: Samara Deist, DPM;  Location: [facility] ORS;  Service: Podiatry;  Laterality: Left;  . NO PAST SURGERIES    . SHOULDER ARTHROSCOPY Right 02/10/2016   Procedure: Limited arthroscopic debridement, arthroscopic SLAP tear, subacromial decompression, mini-open rotator cuff repair, and mini-open biceps tenodesis, right shoulder;  Surgeon: Corky Mull, MD;  Location: [place];  Service: Orthopedics;  Laterality: Right;    FHx:  Family History  Problem Relation Age of Onset  . Hypertension Mother   . Heart disease Maternal Uncle   . Stroke Maternal Grandmother   . Diabetes Maternal Grandmother   . Diabetes Maternal Grandfather   . Colon cancer Maternal Aunt   . Cancer Neg Hx     Social History:  reports that she has  never smoked. She has never used smokeless tobacco. She reports previous alcohol use. She reports that she does not use drugs.  Allergies:  Allergies  Allergen Reactions  . Penicillins Hives  . Etodolac Other (See Comments)    Abdominal pain  . Meloxicam Other (See Comments)    Abdominal pain  . Prednisone Rash    Abdominal pain    Review of Systems: General ROS: negative Respiratory ROS: no cough, shortness of breath, or wheezing Cardiovascular ROS: no chest pain or dyspnea on exertion Gastrointestinal ROS: no abdominal pain, change in bowel habits, or black or bloody stools Musculoskeletal ROS: positive for - joint pain and joint swelling Neurological ROS: negative Dermatological ROS: positive for Left foot incision/ulcer  Medications Prior to Admission  Medication Sig Dispense Refill  . cetirizine (ZYRTEC) 10 MG tablet Take 10 mg by mouth daily.     . ciprofloxacin (CIPRO) 750 MG tablet Take 750 mg by mouth 2 (two) times daily.    Marland Kitchen esomeprazole (NEXIUM) 20 MG capsule Take 20 mg by mouth daily at 12 noon.    . estrogens, conjugated, (PREMARIN) 0.625 MG tablet Take 1 tablet (0.625 mg total) by mouth daily. 90 tablet 0  . medroxyPROGESTERone (PROVERA) 2.5 MG tablet Take 1 tablet (2.5 mg total) by mouth daily. 90 tablet 0  . Multiple Vitamin (MULTIVITAMIN) tablet Take 1 tablet by mouth daily.    Marland Kitchen sulfamethoxazole-trimethoprim (BACTRIM DS) 800-160 MG tablet Take 1 tablet by mouth 2 (two) times daily.    . diphenhydrAMINE (BENADRYL) 25  mg capsule Take 25 mg by mouth daily.       Physical Exam: General: Alert and oriented.  No apparent distress. Vascular: DP/PT pulses palpable bilateral.  Capillary fill time intact to digits bilateral.  Mild swelling present to the left lateral foot with no associated erythema.  Neuro: Light touch sensation intact to digits.  Derm: Incision line to the dorsal lateral fifth metatarsal phalangeal joint left foot appears to be intact and well  coapted with sutures intact, no drainage present, mild edema with minimal to no erythema, no odor.  Packing was removed to the plantar aspect of the left fifth metatarsal phalangeal joint area no purulence was able be expressed.  MSK: Pain on palpation left forefoot  Results for orders placed or performed during the hospital encounter of 12/07/19 (from the past 48 hour(s))  CBC with Differential     Status: None   Collection Time: 12/07/19  7:30 PM  Result Value Ref Range   WBC 5.6 4.0 - 10.5 K/uL   RBC 4.67 3.87 - 5.11 MIL/uL   Hemoglobin 14.2 12.0 - 15.0 g/dL   HCT 40.4 36 - 46 %   MCV 86.5 80.0 - 100.0 fL   MCH 30.4 26.0 - 34.0 pg   MCHC 35.1 30.0 - 36.0 g/dL   RDW 12.5 11.5 - 15.5 %   Platelets 238 150 - 400 K/uL   nRBC 0.0 0.0 - 0.2 %   Neutrophils Relative % 74 %   Neutro Abs 4.2 1.7 - 7.7 K/uL   Lymphocytes Relative 16 %   Lymphs Abs 0.9 0.7 - 4.0 K/uL   Monocytes Relative 8 %   Monocytes Absolute 0.4 0 - 1 K/uL   Eosinophils Relative 2 %   Eosinophils Absolute 0.1 0 - 0 K/uL   Basophils Relative 0 %   Basophils Absolute 0.0 0 - 0 K/uL   Immature Granulocytes 0 %   Abs Immature Granulocytes 0.01 0.00 - 0.07 K/uL    Comment: Performed at [facility], [address] [place], [place] [address]  Comprehensive metabolic panel     Status: Abnormal   Collection Time: 12/07/19  7:30 PM  Result Value Ref Range   Sodium 134 (L) 135 - 145 mmol/L   Potassium 3.5 3.5 - 5.1 mmol/L   Chloride 98 98 - 111 mmol/L   CO2 25 22 - 32 mmol/L   Glucose, Bld 122 (H) 70 - 99 mg/dL    Comment: Glucose reference range applies only to samples taken after fasting for at least 8 hours.   BUN 13 6 - 20 mg/dL   Creatinine, Ser 0.89 0.44 - 1.00 mg/dL   Calcium 9.2 8.9 - 10.3 mg/dL   Total Protein 7.7 6.5 - 8.1 g/dL   Albumin 4.2 3.5 - 5.0 g/dL   AST 27 15 - 41 U/L   ALT 23 0 - 44 U/L   Alkaline Phosphatase 26 (L) 38 - 126 U/L   Total Bilirubin 0.6 0.3 - 1.2 mg/dL   GFR calc non Af  Amer >60 >60 mL/min   GFR calc Af Amer >60 >60 mL/min   Anion gap 11 5 - 15    Comment: Performed at [facility], [address] [place],  [address]  Lactic acid, plasma     Status: None   Collection Time: 12/08/19 12:01 AM  Result Value Ref Range   Lactic Acid, Venous 1.3 0.5 - 1.9 mmol/L    Comment: Performed at [facility],  [place], [place] [address]  Blood culture (routine x 2)     Status: None (Preliminary result)   Collection Time: 12/08/19 12:04 AM   Specimen: BLOOD  Result Value Ref Range   Specimen Description BLOOD RIGHT ANTECUBITAL    Special Requests      BOTTLES DRAWN AEROBIC AND ANAEROBIC Blood Culture adequate volume   Culture      NO GROWTH 1 DAY Performed at [facility], [address] [place], [place] [address]    Report Status PENDING   Blood culture (routine x 2)     Status: None (Preliminary result)   Collection Time: 12/08/19 12:38 AM   Specimen: BLOOD  Result Value Ref Range   Specimen Description BLOOD LEFT ANTECUBITAL    Special Requests      BOTTLES DRAWN AEROBIC AND ANAEROBIC Blood Culture results may not be optimal due to an inadequate volume of blood received in culture bottles   Culture      NO GROWTH 1 DAY Performed at [facility], [address], [place], [place] [address]    Report Status PENDING   Surgical PCR screen     Status: None   Collection Time: 12/08/19  7:10 AM   Specimen: Nasal Mucosa; Nasal Swab  Result Value Ref Range   MRSA, PCR NEGATIVE NEGATIVE   Staphylococcus aureus NEGATIVE NEGATIVE    Comment: (NOTE) The Xpert SA Assay (FDA approved for NASAL specimens in patients 78 years of age and older), is one component of a comprehensive surveillance program. It is not intended to diagnose infection nor to guide or monitor treatment. Performed at [facility], [address] [place], [place] [address]   SARS Coronavirus 2 by RT PCR (hospital order,  performed in [facility] hospital lab) Nasopharyngeal Nasopharyngeal Swab     Status: None   Collection Time: 12/08/19  7:19 AM   Specimen: Nasopharyngeal Swab  Result Value Ref Range   SARS Coronavirus 2 NEGATIVE NEGATIVE    Comment: (NOTE) SARS-CoV-2 target nucleic acids are NOT DETECTED.  The SARS-CoV-2 RNA is generally detectable in upper and lower respiratory specimens during the acute phase of infection. The lowest concentration of SARS-CoV-2 viral copies this assay can detect is 250 copies / mL. A negative result does not preclude SARS-CoV-2 infection and should not be used as the sole basis for treatment or other patient management decisions.  A negative result may occur with improper specimen collection / handling, submission of specimen other than nasopharyngeal swab, presence of viral mutation(s) within the areas targeted by this assay, and inadequate number of viral copies (<250 copies / mL). A negative result must be combined with clinical observations, patient history, and epidemiological information.  Fact Sheet for Patients:   StrictlyIdeas.no  Fact Sheet for Healthcare Providers: BankingDealers.co.za  This test is not yet approved or  cleared by the [place] FDA and has been authorized for detection and/or diagnosis of SARS-CoV-2 by FDA under an Emergency Use Authorization (EUA).  This EUA will remain in effect (meaning this test can be used) for the duration of the COVID-19 declaration under Section 564(b)(1) of the Act, 21 U.S.C. section 360bbb-3(b)(1), unless the authorization is terminated or revoked sooner.  Performed at [facility], [address] [place], [place] [address]   Aerobic/Anaerobic Culture (surgical/deep wound)     Status: None (Preliminary result)   Collection Time: 12/08/19  5:26 PM   Specimen: Foot, Left; Wound  Result Value Ref Range  Specimen Description       TOE Performed at [facility], [address], [place], [place] [address]    Special Requests      LEFT 5TH TOE JOINT Performed at [facility], [place], [place] [address]    Gram Stain PENDING    Culture      NO GROWTH < 24 HOURS Performed at [facility], [place] [address], [place], [place] [address]    Report Status PENDING   Aerobic/Anaerobic Culture (surgical/deep wound)     Status: None (Preliminary result)   Collection Time: 12/08/19  5:35 PM   Specimen: Foot, Left; Wound  Result Value Ref Range   Specimen Description      FOOT LEFT Performed at [facility], [address] [place], [place] [address]    Special Requests ULCER FIFTH TOE JOINT    Gram Stain PENDING    Culture      CULTURE REINCUBATED FOR BETTER GROWTH Performed at [facility], [place] [address] [place], [place] [address]    Report Status PENDING   HIV Antibody (routine testing w rflx)     Status: None   Collection Time: 12/09/19  4:18 AM  Result Value Ref Range   HIV Screen 4th Generation wRfx Non Reactive Non Reactive    Comment: Performed at [facility], [place] [address] [place], [place] [address]  ESR     Status: None   Collection Time: 12/09/19  4:18 AM  Result Value Ref Range   Sed Rate 17 0 - 30 mm/hr    Comment: Performed at [facility], [address] [place], [place] [address]  Basic metabolic panel     Status: Abnormal   Collection Time: 12/09/19  4:18 AM  Result Value Ref Range   Sodium 138 135 - 145 mmol/L   Potassium 4.1 3.5 - 5.1 mmol/L   Chloride 107 98 - 111 mmol/L   CO2 25 22 - 32 mmol/L   Glucose, Bld 138 (H) 70 - 99 mg/dL    Comment: Glucose reference range applies only to samples taken after fasting for at least 8 hours.   BUN 8 6 - 20 mg/dL   Creatinine, Ser 0.60 0.44 - 1.00 mg/dL   Calcium 8.7 (L) 8.9 - 10.3 mg/dL   GFR calc non Af Amer >60 >60 mL/min   GFR calc Af Amer >60 >60 mL/min   Anion gap 6 5  - 15    Comment: Performed at [facility], [address], [place],  [address]   MR FOOT LEFT W WO CONTRAST  Result Date: 12/08/2019 CLINICAL DATA:  Left foot wound near the fifth MTP joint. EXAM: MRI OF THE LEFT FOREFOOT WITHOUT AND WITH CONTRAST TECHNIQUE: Multiplanar, multisequence MR imaging of the left forefoot was performed both before and after administration of intravenous contrast. CONTRAST:  7.58m GADAVIST GADOBUTROL 1 MMOL/ML IV SOLN COMPARISON:  Left foot x-rays from same day. FINDINGS: Bones/Joint/Cartilage Abnormal marrow edema and enhancement with corresponding decreased T1 marrow signal involving the fifth metatarsal head. Mild marrow edema and enhancement involving fifth proximal phalanx. Dorsal and medial subluxation of the fifth proximal phalanx with respect to the fifth metatarsal. No fracture. Large fifth MTP joint effusion. Ligaments Attenuation of the fifth MTP joint collateral ligaments, likely torn. Remaining collateral ligaments are intact. Lisfranc ligament is intact. Muscles and Tendons Flexor and extensor tendons are intact. Small amount of fluid surrounding the fifth flexor  tendon at level of the MTP joint. Soft tissue Dorsal and lateral forefoot soft tissue swelling. No fluid collection or hematoma. No soft tissue mass. IMPRESSION: 1. 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 No abscess. 2. Fifth MTP joint subluxation. 3. Mild fifth flexor tenosynovitis at level of the MTP joint. Electronically Signed   By: Titus Dubin M.D.   On: 12/08/2019 09:28   DG Foot Complete Left  Result Date: 12/08/2019 CLINICAL DATA:  Infection fifth digit pain and swelling EXAM: LEFT FOOT - COMPLETE 3+ VIEW COMPARISON:  None. FINDINGS: Medial subluxation base of fifth proximal phalanx with respect to the head of the metatarsal. Prominent soft tissue swelling at the level of the  MTP joint with probable soft tissue ulcer. Mild cortex deformity at the head of the fifth metatarsal on the lateral side. IMPRESSION: 1. Mild cortex deformity lateral aspect of the head of fifth metatarsal is indeterminate for cortical fracture versus early changes of osteomyelitis. Correlation with MRI is suggested. 2. Medial subluxation of the fifth proximal phalanx with respect to the head of the fifth metatarsal of unknown chronicity. Correlate for any history of recent trauma. Electronically Signed   By: Donavan Foil M.D.   On: 12/08/2019 00:24    Blood pressure 113/67, pulse (!) 50, temperature 97.6 F (36.4 C), temperature source Oral, resp. rate 18, height 5' 2"  (1.575 m), weight 70.8 kg, last menstrual period 07/12/2016, SpO2 98 %.  Assessment 1. Abscess left foot fifth metatarsal phalangeal joint with possible osteomyelitis/septic joint status post incision and drainage  Plan -Patient seen and examined. -Dressing removed and packing removed.  Incision line appears to be well coapted with sutures intact.  No purulent drainage able be expressed.  Minimal edema with no erythema present today.  Patient notes that at some proved since her admission. -Redressed with Xeroform gauze to the incision line and packing site followed by 4 x 4 gauze, Kerlix, Ace wrap.  Patient instructed to keep dressings clean, dry, and intact. -Patient to continue nonweightbearing to left lower extremity and can use the heel for contact for transfers only.  Needs to wear shoe when doing this though. -Culture still pending. -Appreciate recommendations from infectious disease. -Labs appear to be stable at this time with white blood cell count within normal limits, patient is afebrile with vital signs stable, ESR 17. -We will reevaluate again tomorrow.  Caroline More, DPM 12/09/2019, 1:46 PM

## 2019-12-09 NOTE — Evaluation (Signed)
Physical Therapy Evaluation Patient Details Name: Christine Schroeder MRN: [id number] DOB: 01-26-1966 Today's Date: 12/09/2019   History of Present Illness  presented to ER secondary to L fifth toe pain, fever; admitted for management of L 5th toe osteomyelitis, s/p L 5th MTP joint I&D (12/08/19)  Clinical Impression  Upon evaluation, patient alert and oriented; follows commands and eager for OOB efforts.  Rates pain 2-3/10 to L foot; meds received prior to session.  Able to complete bed mobility with mod indep; sit/stand, basic transfers and gait (50') with RW, sup.  Demonstrates hop-to gait pattern with good UB strength, good ability to maintain NWB L LE and appropriate control force of contact R LE at initial contact.  Will plan to complete stair training next session; confirmed with Dr. Vickki Muff that patient okay for heel WBing with heel wedge shoe donned to L foot for stair negotiation. Would benefit from skilled PT to address above deficits and promote optimal return to PLOF.; recommend discharge home with follow up therapy services as recommended by surgeon.    Follow Up Recommendations Follow surgeon's recommendation for DC plan and follow-up therapies    Equipment Recommendations  Rolling walker with 5" wheels    Recommendations for Other Services       Precautions / Restrictions Precautions Precautions: Fall Restrictions Weight Bearing Restrictions: Yes LLE Weight Bearing: Non weight bearing Other Position/Activity Restrictions: L LE      Mobility  Bed Mobility Overal bed mobility: Modified Independent                Transfers Overall transfer level: Needs assistance Equipment used: Rolling walker (2 wheeled) Transfers: Sit to/from Stand Sit to Stand: Supervision         General transfer comment: cuing for hand placement; good adherence to NWB L LE  Ambulation/Gait Ambulation/Gait assistance: Supervision Gait Distance (Feet): 50 Feet Assistive device: Rolling  walker (2 wheeled)       General Gait Details: hop-to gait pattern with good UB strength, good ability to maintain NWB L LE and appropriate control force of contact R LE at initial contact.  Stairs            Wheelchair Mobility    Modified Rankin (Stroke Patients Only)       Balance Overall balance assessment: Needs assistance Sitting-balance support: No upper extremity supported;Feet supported Sitting balance-Leahy Scale: Good     Standing balance support: Bilateral upper extremity supported Standing balance-Leahy Scale: Good                               Pertinent Vitals/Pain Pain Assessment: Faces Faces Pain Scale: Hurts a little bit Pain Location: L foot Pain Descriptors / Indicators: Aching;Guarding Pain Intervention(s): Limited activity within patient's tolerance;Monitored during session;Premedicated before session;Repositioned    Home Living Family/patient expects to be discharged to:: Private residence Living Arrangements: Spouse/significant other Available Help at Discharge: Family;Available 24 hours/day Type of Home: House Home Access: Stairs to enter Entrance Stairs-Rails: Right Entrance Stairs-Number of Steps: 5 Home Layout: Two level;Able to live on main level with bedroom/bathroom Home Equipment: None      Prior Function Level of Independence: Independent         Comments: Indep with ADLs, household and community mobilization upon discharge; currently working from home     Hand Dominance        Extremity/Trunk Assessment   Upper Extremity Assessment Upper Extremity Assessment: Overall WFL for tasks assessed  Lower Extremity Assessment Lower Extremity Assessment: Overall WFL for tasks assessed (L foot with post-op dressing; no drainage visible)       Communication   Communication: No difficulties  Cognition Arousal/Alertness: Awake/alert Behavior During Therapy: WFL for tasks assessed/performed Overall Cognitive  Status: Within Functional Limits for tasks assessed                                        General Comments      Exercises Other Exercises Other Exercises: Educated in role of PT and progressive mobility in acute setting, L LE NWB precautions, donning/doffing/use of L heel wedge shoe; patient voiced understanding of all information.   Assessment/Plan    PT Assessment Patient needs continued PT services  PT Problem List Decreased strength;Decreased range of motion;Decreased activity tolerance;Decreased balance;Decreased mobility;Decreased safety awareness;Decreased knowledge of precautions;Decreased skin integrity;Pain;Decreased knowledge of use of DME       PT Treatment Interventions DME instruction;Gait training;Stair training;Functional mobility training;Therapeutic activities;Therapeutic exercise;Balance training;Patient/family education    PT Goals (Current goals can be found in the Care Plan section)  Acute Rehab PT Goals Patient Stated Goal: to get this taken care of and move forward PT Goal Formulation: With patient Time For Goal Achievement: 12/23/19 Potential to Achieve Goals: Good    Frequency Min 2X/week   Barriers to discharge        Co-evaluation               AM-PAC PT "6 Clicks" Mobility  Outcome Measure Help needed turning from your back to your side while in a flat bed without using bedrails?: None Help needed moving from lying on your back to sitting on the side of a flat bed without using bedrails?: None Help needed moving to and from a bed to a chair (including a wheelchair)?: None Help needed standing up from a chair using your arms (e.g., wheelchair or bedside chair)?: A Little Help needed to walk in hospital room?: A Little Help needed climbing 3-5 steps with a railing? : A Little 6 Click Score: 21    End of Session Equipment Utilized During Treatment: Gait belt Activity Tolerance: Patient tolerated treatment well Patient  left: in bed;with call bell/phone within reach Nurse Communication: Mobility status PT Visit Diagnosis: Muscle weakness (generalized) (M62.81);Difficulty in walking, not elsewhere classified (R26.2);Pain Pain - Right/Left: Left Pain - part of body: Ankle and joints of foot    Time: 0539-7673 PT Time Calculation (min) (ACUTE ONLY): 20 min   Charges:   PT Evaluation $PT Eval Moderate Complexity: 1 Mod PT Treatments $Therapeutic Activity: 8-22 mins       Oumou Smead H. Owens Shark, PT, DPT, NCS 12/09/19, 10:52 AM [phone]

## 2019-12-10 DIAGNOSIS — M009 Pyogenic arthritis, unspecified: Secondary | ICD-10-CM

## 2019-12-10 LAB — CREATININE, SERUM
Creatinine, Ser: 0.72 mg/dL (ref 0.44–1.00)
GFR calc Af Amer: >60 mL/min (ref >60)
GFR calc non Af Amer: >60 mL/min (ref >60)

## 2019-12-10 MED ORDER — LINEZOLID 600 MG PO TABS: 600.0000 mg | ORAL_TABLET | Freq: Two times a day (BID) | ORAL | 0 refills | Status: AC

## 2019-12-10 MED ORDER — CIPROFLOXACIN HCL 750 MG PO TABS: 750.0000 mg | ORAL_TABLET | Freq: Two times a day (BID) | ORAL | 0 refills | Status: AC

## 2019-12-10 NOTE — Discharge Summary (Signed)
Physician Discharge Summary  Patient ID: Christine Schroeder MRN: [id number] DOB/AGE: Jul 01, 1965 54 y.o.  Admit date: 12/07/2019 Discharge date: 12/10/2019  Admission Diagnoses:  Discharge Diagnoses:  Active Problems:   Left foot infection   Septic arthritis of interphalangeal joint of toe of left foot [facility])   Discharged Condition: good  Hospital Course:  [name], hormone replacement therapy for menopausal symptoms, allergic rhinitis comes to the emergency room with left foot pain. Patient reports back in May 2021 she had injection of steroid on her left fifth toe bunion, which caused subsequent infections.  MRI of the left foot showed Fifth MTP joint septic arthritis with osteomyelitis, I&D was performed on 8/29.  Patient treated with Rocephin and vancomycin pending culture results.  8/31.  Blood culture came back no growth, urine culture has no growth so far.  Discussed with Dr. Ola Schroeder, he was able to get a hold of patient previous culture from another institution, there is a multiple bacteria growth.  He recommended antibiotic treatment with Cipro and Zyvox until day of 9/17.  Currently patient is medically stable to be discharged. Of note, patient does not have osteomyelitis on surgery.    Consults: Podiatry and ID  Significant Diagnostic Studies:  MRI OF THE LEFT FOREFOOT WITHOUT AND WITH CONTRAST  TECHNIQUE: Multiplanar, multisequence MR imaging of the left forefoot was performed both before and after administration of intravenous contrast.  CONTRAST:  7.88mL GADAVIST GADOBUTROL 1 MMOL/ML IV SOLN  COMPARISON:  Left foot x-rays from same day.  FINDINGS: Bones/Joint/Cartilage  Abnormal marrow edema and enhancement with corresponding decreased T1 marrow signal involving the fifth metatarsal head. Mild marrow edema and enhancement involving fifth proximal  phalanx. Dorsal and medial subluxation of the fifth proximal phalanx with respect to the fifth metatarsal. No fracture. Large fifth MTP joint effusion.  Ligaments  Attenuation of the fifth MTP joint collateral ligaments, likely torn. Remaining collateral ligaments are intact. Lisfranc ligament is intact.  Muscles and Tendons Flexor and extensor tendons are intact. Small amount of fluid surrounding the fifth flexor tendon at level of the MTP joint.  Soft tissue Dorsal and lateral forefoot soft tissue swelling. No fluid collection or hematoma. No soft tissue mass.  IMPRESSION: 1. 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 No abscess. 2. Fifth MTP joint subluxation. 3. Mild fifth flexor tenosynovitis at level of the MTP joint.   Electronically Signed   By: Christine Schroeder M.D.   On: 12/08/2019 09:28   Treatments: Rocephin and vancomycin. Incision and drainage fifth metatarsophalangeal joint  Discharge Exam: Blood pressure (!) 95/56, pulse (!) 57, temperature 98.4 F (36.9 C), temperature source Oral, resp. rate 20, height 5\' 2"  (1.575 m), weight 70.8 kg, last menstrual period 07/12/2016, SpO2 98 %. General appearance: alert and cooperative Resp: clear to auscultation bilaterally Cardio: regular rate and rhythm, S1, S2 normal, no murmur, click, rub or gallop GI: soft, non-tender; bowel sounds normal; no masses,  no organomegaly Extremities: extremities normal, atraumatic, no cyanosis or edema  Disposition: Discharge disposition: 01-Home or Self Care       Discharge Instructions    Diet - low sodium heart healthy   Complete by: As directed    Discharge wound care:   Complete by: As directed    Change dressing daily by husband   Increase activity slowly   Complete by: As directed  Allergies as of 12/10/2019      Reactions   Penicillins Hives   Etodolac  Other (See Comments)   Abdominal pain   Meloxicam Other (See Comments)   Abdominal pain   Prednisone Rash   Abdominal pain      Medication List    STOP taking these medications   sulfamethoxazole-trimethoprim 800-160 MG tablet Commonly known as: BACTRIM DS     TAKE these medications   cetirizine 10 MG tablet Commonly known as: ZYRTEC Take 10 mg by mouth daily.   ciprofloxacin 750 MG tablet Commonly known as: CIPRO Take 1 tablet (750 mg total) by mouth 2 (two) times daily for 17 days.   diphenhydrAMINE 25 mg capsule Commonly known as: BENADRYL Take 25 mg by mouth daily.   esomeprazole 20 MG capsule Commonly known as: NEXIUM Take 20 mg by mouth daily at 12 noon.   estrogens (conjugated) 0.625 MG tablet Commonly known as: Premarin Take 1 tablet (0.625 mg total) by mouth daily.   linezolid 600 MG tablet Commonly known as: Zyvox Take 1 tablet (600 mg total) by mouth 2 (two) times daily for 17 days.   medroxyPROGESTERone 2.5 MG tablet Commonly known as: Provera Take 1 tablet (2.5 mg total) by mouth daily.   multivitamin tablet Take 1 tablet by mouth daily.            Durable Medical Equipment  (From admission, onward)         Start     Ordered   12/10/19 1408  For home use only DME Walker rolling  Once       Question Answer Comment  Walker: With 5 Inch Wheels   Patient needs a walker to treat with the following condition Abscess or cellulitis of foot      12/10/19 1408           Discharge Care Instructions  (From admission, onward)         Start     Ordered   12/10/19 0000  Discharge wound care:       Comments: Change dressing daily by husband   12/10/19 1410          Follow-up Information    Christine Koch, NP Follow up in 1 week(s).   Specialty: Internal Medicine Contact information: [place] [address] [phone]        Christine Schroeder, DPM Follow up in 1 week(s).   Specialty: Podiatry Contact  information: [place] [address] [phone]        Christine Ramsay, MD Follow up in 10 day(s).   Specialty: Infectious Diseases Contact information: [place] [address] [phone]               Signed: Sharen Schroeder 12/10/2019, 2:13 PM

## 2019-12-10 NOTE — Progress Notes (Signed)
[place] INFECTIOUS DISEASE PROGRESS NOTE Date of Admission:  12/07/2019     ID: Christine Schroeder is a 54 y.o. female with  Septic arthritis Active Problems:   Left foot infection   Osteomyelitis (HCC)   Subjective: No fevers, cxs neg to date.   ROS  Eleven systems are reviewed and negative except per hpi  Medications:  Antibiotics Given (last 72 hours)    Date/Time Action Medication Dose Rate   12/08/19 0715 New Bag/Given   cefTRIAXone (ROCEPHIN) 1 g in sodium chloride 0.9 % 100 mL IVPB 1 g 200 mL/hr   12/08/19 0749 New Bag/Given   vancomycin (VANCOCIN) IVPB 1000 mg/200 mL premix 1,000 mg 200 mL/hr   12/08/19 1015 New Bag/Given   vancomycin (VANCOREADY) IVPB 750 mg/150 mL 750 mg 150 mL/hr   12/08/19 2123 New Bag/Given   vancomycin (VANCOCIN) IVPB 1000 mg/200 mL premix 1,000 mg 200 mL/hr   12/09/19 0443 New Bag/Given   cefTRIAXone (ROCEPHIN) 1 g in sodium chloride 0.9 % 100 mL IVPB 1 g 200 mL/hr   12/09/19 1142 New Bag/Given   vancomycin (VANCOCIN) IVPB 1000 mg/200 mL premix 1,000 mg 200 mL/hr   12/09/19 2201 New Bag/Given   vancomycin (VANCOCIN) IVPB 1000 mg/200 mL premix 1,000 mg 200 mL/hr   12/10/19 6811 New Bag/Given   cefTRIAXone (ROCEPHIN) 1 g in sodium chloride 0.9 % 100 mL IVPB 1 g 200 mL/hr   12/10/19 1019 New Bag/Given   vancomycin (VANCOCIN) IVPB 1000 mg/200 mL premix 1,000 mg 200 mL/hr     . estrogens (conjugated)  0.625 mg Oral Daily  . loratadine  10 mg Oral Daily  . medroxyPROGESTERone  2.5 mg Oral Daily  . pantoprazole  40 mg Oral Daily    Objective: Vital signs in last 24 hours: Temp:  [98.4 F (36.9 C)-98.5 F (36.9 C)] 98.4 F (36.9 C) (08/31 0433) Pulse Rate:  [57-61] 57 (08/31 0433) Resp:  [20] 20 (08/31 0433) BP: (92-95)/(50-56) 95/56 (08/31 0433) SpO2:  [97 %-98 %] 98 % (08/31 0433) Physical Exam  Constitutional:  oriented to person, place, and time. appears well-developed and well-nourished. No distress.  HENT: [place]/AT, PERRLA, no  scleral icterus Mouth/Throat: Oropharynx is clear and moist. No oropharyngeal exudate.  Cardiovascular: Normal rate, regular rhythm and normal heart sounds. Exam reveals no gallop and no friction rub.  No murmur heard.  Pulmonary/Chest: Effort normal and breath sounds normal. No respiratory distress.  has no wheezes.  Neck = supple, no nuchal rigidity Abdominal: Soft. Bowel sounds are normal.  exhibits no distension. There is no tenderness.  Lymphadenopathy: no cervical adenopathy. No axillary adenopathy Neurological: alert and oriented to person, place, and time.  Skin: L foot wrapped. Upon uwrapped well coapted incision     Lab Results Recent Labs    12/07/19 1930 12/07/19 1930 12/09/19 0418 12/10/19 0642  WBC 5.6  --   --   --   HGB 14.2  --   --   --   HCT 40.4  --   --   --   NA 134*  --  138  --   K 3.5  --  4.1  --   CL 98  --  107  --   CO2 25  --  25  --   BUN 13  --  8  --   CREATININE 0.89   < > 0.60 0.72   < > = values in this interval not displayed.    Microbiology: Results for orders  placed or performed during the hospital encounter of 12/07/19  Blood culture (routine x 2)     Status: None (Preliminary result)   Collection Time: 12/08/19 12:04 AM   Specimen: BLOOD  Result Value Ref Range Status   Specimen Description BLOOD RIGHT ANTECUBITAL  Final   Special Requests   Final    BOTTLES DRAWN AEROBIC AND ANAEROBIC Blood Culture adequate volume   Culture   Final    NO GROWTH 2 DAYS Performed at [facility], [address] [place], [place] [address]    Report Status PENDING  Incomplete  Blood culture (routine x 2)     Status: None (Preliminary result)   Collection Time: 12/08/19 12:38 AM   Specimen: BLOOD  Result Value Ref Range Status   Specimen Description BLOOD LEFT ANTECUBITAL  Final   Special Requests   Final    BOTTLES DRAWN AEROBIC AND ANAEROBIC Blood Culture results may not be optimal due to an inadequate volume of blood received in  culture bottles   Culture   Final    NO GROWTH 2 DAYS Performed at [facility], [address], [place], [place] [address]    Report Status PENDING  Incomplete  Surgical PCR screen     Status: None   Collection Time: 12/08/19  7:10 AM   Specimen: Nasal Mucosa; Nasal Swab  Result Value Ref Range Status   MRSA, PCR NEGATIVE NEGATIVE Final   Staphylococcus aureus NEGATIVE NEGATIVE Final    Comment: (NOTE) The Xpert SA Assay (FDA approved for NASAL specimens in patients 81 years of age and older), is one component of a comprehensive surveillance program. It is not intended to diagnose infection nor to guide or monitor treatment. Performed at [facility], [address] [place], [place] [address]   SARS Coronavirus 2 by RT PCR (hospital order, performed in [facility] hospital lab) Nasopharyngeal Nasopharyngeal Swab     Status: None   Collection Time: 12/08/19  7:19 AM   Specimen: Nasopharyngeal Swab  Result Value Ref Range Status   SARS Coronavirus 2 NEGATIVE NEGATIVE Final    Comment: (NOTE) SARS-CoV-2 target nucleic acids are NOT DETECTED.  The SARS-CoV-2 RNA is generally detectable in upper and lower respiratory specimens during the acute phase of infection. The lowest concentration of SARS-CoV-2 viral copies this assay can detect is 250 copies / mL. A negative result does not preclude SARS-CoV-2 infection and should not be used as the sole basis for treatment or other patient management decisions.  A negative result may occur with improper specimen collection / handling, submission of specimen other than nasopharyngeal swab, presence of viral mutation(s) within the areas targeted by this assay, and inadequate number of viral copies (<250 copies / mL). A negative result must be combined with clinical observations, patient history, and epidemiological information.  Fact Sheet for Patients:   StrictlyIdeas.no  Fact Sheet for  Healthcare Providers: BankingDealers.co.za  This test is not yet approved or  cleared by the [place] FDA and has been authorized for detection and/or diagnosis of SARS-CoV-2 by FDA under an Emergency Use Authorization (EUA).  This EUA will remain in effect (meaning this test can be used) for the duration of the COVID-19 declaration under Section 564(b)(1) of the Act, 21 U.S.C. section 360bbb-3(b)(1), unless the authorization is terminated or revoked sooner.  Performed at [facility], [address] [place], [place] [address]   Aerobic/Anaerobic Culture (surgical/deep wound)     Status: None (Preliminary result)  Collection Time: 12/08/19  5:26 PM   Specimen: Foot, Left; Wound  Result Value Ref Range Status   Specimen Description   Final    TOE Performed at [facility], [address], [place], [place] [address]    Special Requests   Final    LEFT 5TH TOE JOINT Performed at [facility], [address] [place], [place] [address]    Gram Stain PENDING  Incomplete   Culture   Final    NO GROWTH 2 DAYS NO ANAEROBES ISOLATED; CULTURE IN PROGRESS FOR 5 DAYS Performed at [facility], [place] [address] [place], [place] [address]    Report Status PENDING  Incomplete  Aerobic/Anaerobic Culture (surgical/deep wound)     Status: None (Preliminary result)   Collection Time: 12/08/19  5:35 PM   Specimen: Foot, Left; Wound  Result Value Ref Range Status   Specimen Description   Final    FOOT LEFT Performed at [facility], [address] [place],  [address]    Special Requests ULCER FIFTH TOE JOINT  Final   Gram Stain   Final    RARE WBC PRESENT, PREDOMINANTLY MONONUCLEAR NO ORGANISMS SEEN Performed at [facility], [place] [address] [place],  [address]    Culture   Final    CULTURE REINCUBATED FOR BETTER GROWTH NO ANAEROBES ISOLATED; CULTURE IN PROGRESS FOR 5 DAYS    Report Status  PENDING  Incomplete    Studies/Results: No results found.  Assessment/Plan: I received culture results from outside podiatrist.  On August 20 her culture grew Enterococcus, staph epidermidis and corynebacterium.  Prior cultures done on July 1 of 2021 grew Serratia, corynebacterium and Enterococcus.  She is allergic to penicillins.  At this point rec starting linezolid 600 bid and cipro 500 bid for 2 weeks with close follow up with podiatry  I will see in 2 weeks   Thank you very much for the consult. Will follow with you.  Leonel Ramsay   12/10/2019, 1:20 PM

## 2019-12-10 NOTE — Progress Notes (Signed)
Pharmacy - Antimicrobial Stewardship (linezolid procurement and prior authorization)  Called [facility] to get copay and informed prior authorization required.  Online form completed, awaiting approval.  I called patient to inform her of situation.  Told her to take bactrim BID (with her ciprofloxacin as prescribed) until we hear back regarding prior authorization.    Doreene Eland, PharmD, BCPS.   Work Cell: [phone] 12/10/2019 5:14 PM

## 2019-12-10 NOTE — Clinical Social Work Note (Signed)
Patient has orders to discharge home today. Ordered rolling walker through [facility]. Podiatry does not think patient needs home health. He said the husband can do dressing changes and patient is comfortable with weight-bearing instructions. No further concerns. CSW signing off.  Dayton Scrape, [place]

## 2019-12-10 NOTE — Progress Notes (Signed)
Daily Progress Note   Subjective  - 2 Days Post-Op  F/u I and d  Objective Vitals:   12/09/19 0843 12/09/19 1151 12/09/19 2116 12/10/19 0433  BP: 104/64 113/67 (!) 92/50 (!) 95/56  Pulse: (!) 58 (!) 50 61 (!) 57  Resp: 18  20 20   Temp: 97.8 F (36.6 C) 97.6 F (36.4 C) 98.5 F (36.9 C) 98.4 F (36.9 C)  TempSrc: Oral Oral Oral Oral  SpO2:  98% 97% 98%  Weight:      Height:        Physical Exam: Wound is stable. No erythema.  No pus today  Laboratory CBC    Component Value Date/Time   WBC 5.6 12/07/2019 1930   HGB 14.2 12/07/2019 1930   HGB 14.2 01/09/2019 0816   HCT 40.4 12/07/2019 1930   HCT 43.1 01/09/2019 0816   PLT 238 12/07/2019 1930   PLT 259 01/09/2019 0816    BMET    Component Value Date/Time   NA 138 12/09/2019 0418   NA 140 01/09/2019 0816   K 4.1 12/09/2019 0418   CL 107 12/09/2019 0418   CO2 25 12/09/2019 0418   GLUCOSE 138 (H) 12/09/2019 0418   BUN 8 12/09/2019 0418   BUN 16 01/09/2019 0816   CREATININE 0.72 12/10/2019 0642   CALCIUM 8.7 (L) 12/09/2019 0418   GFRNONAA >60 12/10/2019 0642   GFRAA >60 12/10/2019 2761    Assessment/Planning: Abscess with septic arthritis left 5th mtpj Concern for osteo based on MRI but no gross findings intra-op   Awaiting cultures.  Appreciate ID input  C/W NWB with partial wb to heel prn  D/W pt an husband dressings.  Cleanse and paint with betadine and padded gauze dressing daily.  F/u  outpt I 1week  Elesa Hacker  12/10/2019, 12:51 PM

## 2019-12-10 NOTE — Consult Note (Signed)
Pharmacy Antibiotic Note  Christine Schroeder is a 54 y.o. female admitted on 12/07/2019 with osteomyelitis of the left foot. Pt was prescribed bactrim and cipro from podiatrist last week. Pharmacy has been consulted for vancomycin dosing. Pt received vancomycin 1 g in ED and additional 750 mg IV x 1 dose for adequate load.    Plan: Continue Vancomycin 1000 mg IV every 12 hours.   Goal trough 15-20 mcg/mL.  Monitor SCr daily and plan for levels per protocol.  Will order vancomycin trough for 9/1 at 0930 prior to dose on 9/1 at 1000 (6th dose of regimen).   Height: 5\' 2"  (157.5 cm) Weight: 70.8 kg (156 lb) IBW/kg (Calculated) : 50.1  Temp (24hrs), Avg:98.5 F (36.9 C), Min:98.4 F (36.9 C), Max:98.5 F (36.9 C)  Recent Labs  Lab 12/07/19 1930 12/08/19 0001 12/09/19 0418 12/10/19 0642  WBC 5.6  --   --   --   CREATININE 0.89  --  0.60 0.72  LATICACIDVEN  --  1.3  --   --     Estimated Creatinine Clearance: 75 mL/min (by C-G formula based on SCr of 0.72 mg/dL).    Allergies  Allergen Reactions  . Penicillins Hives  . Etodolac Other (See Comments)    Abdominal pain  . Meloxicam Other (See Comments)    Abdominal pain  . Prednisone Rash    Abdominal pain    Antimicrobials this admission: 8/29 Ceftriaxone >>  8/29 vancomycin >>   Microbiology results: 8/29 BCx: NGTD 8/29 Wcx (ulcer): reincubated for better growth 8/29 WCx (toe): NGTD  Thank you for allowing pharmacy to be a part of this patient's care.  Rayna Sexton, PharmD, BCPS Clinical Pharmacist 12/10/2019 1:15 PM

## 2019-12-10 NOTE — Progress Notes (Signed)
Physical Therapy Treatment Patient Details Name: Christine Schroeder MRN: [id number] DOB: 07-14-1965 Today's Date: 12/10/2019    History of Present Illness presented to ER secondary to L fifth toe pain, fever; admitted for management of L 5th toe osteomyelitis, s/p L 5th MTP joint I&D (12/08/19)    PT Comments    Pt alert, in bed, eager to perform stair training in order to go home. Pt reported no pain throughout session, BP sitting EOB 119/76.  Pt performed bed mobility modI, and transfers modI, one reminder of hand placement. She was able to hop ~16ft total and perform stair training. CGA provided for stairs, confirmed with Dr. Vickki Muff that patient okay for heel WBing with heel wedge shoe donned to L foot for stair negotiation. Pt instructed in technique but overall steady, safe, and verbalized understanding. Pt returned to room, up in recliner at end of session. The patient would benefit from further skilled PT intervention to continue to progress towards goals. Recommendation remains appropriate.     Follow Up Recommendations  Follow surgeons recommendation for DC plan and follow-up therapies     Equipment Recommendations  Rolling walker with 5" wheels    Recommendations for Other Services       Precautions / Restrictions Precautions Precautions: Fall Restrictions Weight Bearing Restrictions: Yes LLE Weight Bearing: Non weight bearing Other Position/Activity Restrictions: L LE    Mobility  Bed Mobility Overal bed mobility: Modified Independent                Transfers Overall transfer level: Modified independent Equipment used: Standard walker Transfers: Sit to/from Stand           General transfer comment: cuing for hand placement; good adherence to NWB L LE  Ambulation/Gait Ambulation/Gait assistance: Modified independent (Device/Increase time) Gait Distance (Feet): 80 Feet Assistive device: Standard walker       General Gait Details: pt with standard  walker in room, reported improved steadiness with standard walker, able to maintain NWB precautions throughout transfers and ambulation   Stairs Stairs: Yes   Stair Management: One rail Left Number of Stairs: 6 General stair comments: confirmed with Dr. Vickki Muff that patient okay for heel WBing with heel wedge shoe donned to L foot for stair negotiation. pt demonstrated excellent heel weight bearing and then cued for sequencing   Wheelchair Mobility    Modified Rankin (Stroke Patients Only)       Balance Overall balance assessment: Needs assistance Sitting-balance support: Feet supported Sitting balance-Leahy Scale: Good     Standing balance support: Bilateral upper extremity supported Standing balance-Leahy Scale: Good                              Cognition Arousal/Alertness: Awake/alert Behavior During Therapy: WFL for tasks assessed/performed Overall Cognitive Status: Within Functional Limits for tasks assessed                                        Exercises      General Comments        Pertinent Vitals/Pain Pain Assessment: No/denies pain    Home Living                      Prior Function            PT Goals (current goals can now be found in the  care plan section) Progress towards PT goals: Progressing toward goals    Frequency    Min 2X/week      PT Plan Current plan remains appropriate    Co-evaluation              AM-PAC PT "6 Clicks" Mobility   Outcome Measure  Help needed turning from your back to your side while in a flat bed without using bedrails?: None Help needed moving from lying on your back to sitting on the side of a flat bed without using bedrails?: None Help needed moving to and from a bed to a chair (including a wheelchair)?: None Help needed standing up from a chair using your arms (e.g., wheelchair or bedside chair)?: None Help needed to walk in hospital room?: None Help needed  climbing 3-5 steps with a railing? : A Little 6 Click Score: 23    End of Session Equipment Utilized During Treatment: Gait belt Activity Tolerance: Patient tolerated treatment well Patient left: in chair;with call bell/phone within reach Nurse Communication: Mobility status PT Visit Diagnosis: Muscle weakness (generalized) (M62.81);Difficulty in walking, not elsewhere classified (R26.2);Pain Pain - Right/Left: Left Pain - part of body: Ankle and joints of foot     Time: 4580-9983 PT Time Calculation (min) (ACUTE ONLY): 26 min  Charges:  $Gait Training: 8-22 mins $Therapeutic Exercise: 8-22 mins                     Lieutenant Diego PT, DPT 10:12 AM,12/10/19

## 2019-12-11 ENCOUNTER — Telehealth: Payer: Self-pay

## 2019-12-11 NOTE — Telephone Encounter (Signed)
Noted, will remove myself as her PCP.

## 2019-12-11 NOTE — Telephone Encounter (Signed)
1st attempt- Left message on voicemail to return my call- need to complete TCM and schedule hospital follow up visit.

## 2019-12-11 NOTE — Telephone Encounter (Signed)
Transition Care Management Follow-up Telephone Call  Date of discharge and from where: 12/10/2019, [facility]  How have you been since you were released from the hospital? Patient states that she is doing okay.   Any questions or concerns? No  Items Reviewed:  Did the pt receive and understand the discharge instructions provided? Yes   Medications obtained and verified? Yes   Any new allergies since your discharge? No   Dietary orders reviewed? Yes  Do you have support at home? Yes   Functional Questionnaire: (I = Independent and D = Dependent) ADLs: I  Bathing/Dressing- I  Meal Prep- I  Eating- I  Maintaining continence- I  Transferring/Ambulation- I  Managing Meds- I  Follow up appointments reviewed:   PCP Hospital f/u appt confirmed? Patient states that she is receiving care at the [facility] now. She no longer receives care from this office.    [facility] f/u appt confirmed? N/A   Are transportation arrangements needed? No   If their condition worsens, is the pt aware to call PCP or go to the Emergency Dept.? Yes  Was the patient provided with contact information for the PCP's office or ED? Yes  Was to pt encouraged to call back with questions or concerns? Yes

## 2019-12-13 LAB — AEROBIC/ANAEROBIC CULTURE W GRAM STAIN (SURGICAL/DEEP WOUND)
Culture: NO GROWTH
Gram Stain: NONE SEEN

## 2019-12-13 LAB — CULTURE, BLOOD (ROUTINE X 2)
Culture: NO GROWTH
Culture: NO GROWTH
Special Requests: ADEQUATE

## 2019-12-17 NOTE — Telephone Encounter (Signed)
This is fine.  We can start her on estradiol 1 mg and given 90 day supply along with her medroxyprogesterone.

## 2019-12-18 ENCOUNTER — Other Ambulatory Visit: Payer: Self-pay

## 2019-12-18 MED ORDER — ESTRADIOL 1 MG PO TABS: 1.0000 mg | ORAL_TABLET | Freq: Every day | ORAL | 3 refills | Status: DC

## 2020-01-02 ENCOUNTER — Other Ambulatory Visit: Payer: Self-pay | Admitting: Podiatry

## 2020-01-09 ENCOUNTER — Other Ambulatory Visit: Payer: Self-pay

## 2020-01-09 ENCOUNTER — Encounter
Admission: RE | Admit: 2020-01-09 | Discharge: 2020-01-09 | Disposition: A | Discharge status: Home / Self Care | Payer: Managed Care, Other (non HMO) | Source: Ambulatory Visit | Attending: Podiatry | Admitting: Podiatry

## 2020-01-09 NOTE — Progress Notes (Signed)
  [facility] Perioperative Services: Pre-Admission/Anesthesia Testing   Date: 01/09/20  Name: Christine Schroeder MRN:   [id number]  Re: Consideration of perioperative therapeutic ABX change in patient with PCN allergy  Request sent to: Samara Deist, DPM Notification mode: Routed and/or faxed via [facility]   Procedure: BUNIONECTOMY TAILOR'S (Left Toe) Date of procedure: 01/17/2020  Notes: 1. Patient has a documented allergy to PCN.  Marland Kitchen Advising that PCN has caused her to experience hives in the past.  2. Received cephalosporin (CEFAZOLIN) on 12/08/2019 with no documented complications. 3. Screened as appropriate for cephalosporin use during medication reconciliation . No immediate angioedema, dysphagia, SOB, or anaphylaxis symptoms. . No severe rash involving mucous membranes or skin necrosis. . No hospital admissions related to side effects of PCN/cephalosporin use.  . No documented reaction to PCN or cephalosporin in the last 10 years.  Currently ordered preoperative prophylactic ABX: vancomycin.   Request: As an evidence based approach to reducing the rate of incidence for post-operative SSI and the development of MDROs, could an agent with narrower coverage for preoperative prophylaxis in this patient's upcoming surgical course be considered?  1. Specifically requesting change to cephalosporin (CEFAZOLIN).  2. Please have your staff communicate decision with me and I would be happy to change the orders in Epic as per your direction.   Things to consider: . Many patients report that they were "allergic" to PCN earlier in life, however this does not translate into a true lifelong allergy. Patients can lose sensitivity to specific IgE antibodies over time if PCN is avoided (Kleris & Lugar, 2019).  Marland Kitchen Up to 10% of the adult population and 15% of hospitalized patients report an allergy to PCN, however clinical studies suggest that 90% of those reporting an allergy can  tolerate PCN antibiotics (Kleris & Lugar, 2019).  . Cross-sensitivity between PCN and cephalosporins has been documented as being as high as 10%, however this estimation included data believed to have been collected in a setting where there was contamination. Newer data suggests that the prevalence of cross-sensitivity between PCN and cephalosporins is actually estimated to be closer to 1% (Hermanides et al., 2018).   . Patients labeled as PCN allergic, whether they are truly allergic or not, have been found to have inferior outcomes in terms of rates of serious infection, and these patients tend to have longer hospital stays (Duck, 2019).  . Treatment related secondary infections, such as Clostridioides difficile, have been linked to the improper use of broad spectrum antibiotics in patients improperly labeled as PCN allergic (Kleris & Lugar, 2019).  Marland Kitchen Anaphylaxis from cephalosporins is rare and the evidence suggests that there is no increased risk of an anaphylactic type reaction when cephalosporins are used in a PCN allergic patient (Pichichero, 2006).   Citations Hermanides J, Lemkes BA, Prins JM, Hollmann MW, Terreehorst I. Presumed ?-Lactam Allergy and Cross-reactivity in the Operating Theater: A Practical Approach. Anesthesiology. 2018 Aug;129(2):335-342. doi: 10.1097/ALN.0000000000002252. PMID: 18563149.  Kleris, [place]., & Lugar, P. L. (2019). Things We Do For No Reason: Failing to Question a Penicillin Allergy History. Journal of hospital medicine, 14(10), 7735982352. Advance online publication. https://www.wallace-middleton.info/  Pichichero, M. E. (2006). Cephalosporins can be prescribed safely for penicillin-allergic patients. Journal of family medicine, 55(2), 106-112. Accessed: https://cdn.mdedge.com/files/s51fs-public/Document/September-2017/5502JFP_AppliedEvidence1.pdf  Honor Loh, MSN, APRN, FNP-C, CEN [facility]  Peri-operative Services Nurse  Practitioner Phone: [phone] 01/09/20 3:24 PM

## 2020-01-09 NOTE — Patient Instructions (Signed)
Your procedure is scheduled on: Friday January 17, 2020. Report to Day Surgery inside [place] 2nd floor. To find out your arrival time please call [phone] between 1PM - 3PM on Thursday January 16, 2020.  Remember: Instructions that are not followed completely may result in serious medical risk,  up to and including death, or upon the discretion of your surgeon and anesthesiologist your  surgery may need to be rescheduled.     _X__ 1. Do not eat food after midnight the night before your procedure.                 No chewing gum or hard candies. You may drink clear liquids up to 2 hours                 before you are scheduled to arrive for your surgery- DO not drink clear                 liquids within 2 hours of the start of your surgery.                 Clear Liquids include:  water, apple juice without pulp, clear Gatorade, G2 or                  Gatorade Zero (avoid Red/Purple/Blue), Black Coffee or Tea (Do not add                 anything to coffee or tea).  __x__2.   Complete the "Ensure Clear Pre-surgery Clear Carbohydrate Drink" provided to you, 2 hours before arrival. **If you       are diabetic you will be provided with an alternative drink, Gatorade Zero or G2.  __X__3.  On the morning of surgery brush your teeth with toothpaste and water, you                may rinse your mouth with mouthwash if you wish.  Do not swallow any toothpaste of mouthwash.     _X__ 4.  No Alcohol for 24 hours before or after surgery.   _X__ 5.  Do Not Smoke or use e-cigarettes For 24 Hours Prior to Your Surgery.                 Do not use any chewable tobacco products for at least 6 hours prior to                 Surgery.  _X__  6.  Do not use any recreational drugs (marijuana, cocaine, heroin, ecstasy, MDMA or other)                For at least one week prior to your surgery.  Combination of these drugs with anesthesia                May have life threatening  results.  __X__  7.  Notify your doctor if there is any change in your medical condition      (cold, fever, infections).     Do not wear jewelry, make-up, hairpins, clips or nail polish. Do not wear lotions, powders, or perfumes. You may wear deodorant. Do not shave 48 hours prior to surgery. Men may shave face and neck. Do not bring valuables to the hospital.    [facility] is not responsible for any belongings or valuables.  Contacts, dentures or bridgework may not be worn into surgery. Leave your suitcase in the car. After surgery it may  be brought to your room. For patients admitted to the hospital, discharge time is determined by your treatment team.   Patients discharged the day of surgery will not be allowed to drive home.   Make arrangements for someone to be with you for the first 24 hours of your Same Day Discharge.    Please read over the following fact sheets that you were given:   Incentive Spirometer instructions sheet   __x__ Take these medicines the morning of surgery with A SIP OF WATER:    1. esomeprazole (NEXIUM) 20 MG    __x__ Use CHG Soap (or wipes) as directed  __x__ Stop Anti-inflammatories such as Ibuprofen, Aleve, Advil, naproxen, aspirin and or BC powders.   __x__ Stop supplements until after surgery.    __x__ Do not start any herbal supplements before your procedure.    If you have any questions regarding your pre-procedure instructions,  Please call Pre-admit Testing at [phone].

## 2020-01-15 ENCOUNTER — Other Ambulatory Visit
Admission: RE | Admit: 2020-01-15 | Discharge: 2020-01-15 | Disposition: A | Discharge status: Home / Self Care | Payer: Managed Care, Other (non HMO) | Source: Ambulatory Visit | Attending: Podiatry | Admitting: Podiatry

## 2020-01-15 ENCOUNTER — Other Ambulatory Visit: Payer: Self-pay

## 2020-01-15 DIAGNOSIS — Z01812 Encounter for preprocedural laboratory examination: Secondary | ICD-10-CM | POA: Diagnosis not present

## 2020-01-15 DIAGNOSIS — Z20822 Contact with and (suspected) exposure to covid-19: Secondary | ICD-10-CM | POA: Insufficient documentation

## 2020-01-15 LAB — SARS CORONAVIRUS 2 (TAT 6-24 HRS): SARS Coronavirus 2: NEGATIVE

## 2020-01-17 ENCOUNTER — Encounter
Admission: RE | Disposition: A | Discharge status: Home / Self Care | Payer: Self-pay | Source: Home / Self Care | Attending: Podiatry

## 2020-01-17 ENCOUNTER — Ambulatory Visit: Payer: Managed Care, Other (non HMO)

## 2020-01-17 ENCOUNTER — Ambulatory Visit: Payer: Managed Care, Other (non HMO) | Admitting: Certified Registered Nurse Anesthetist

## 2020-01-17 ENCOUNTER — Encounter: Payer: Self-pay | Admitting: Podiatry

## 2020-01-17 ENCOUNTER — Ambulatory Visit
Admission: RE | Admit: 2020-01-17 | Discharge: 2020-01-17 | Disposition: A | Discharge status: Home / Self Care | Payer: Managed Care, Other (non HMO) | Attending: Podiatry | Admitting: Podiatry

## 2020-01-17 ENCOUNTER — Other Ambulatory Visit: Payer: Self-pay

## 2020-01-17 DIAGNOSIS — K219 Gastro-esophageal reflux disease without esophagitis: Secondary | ICD-10-CM | POA: Diagnosis not present

## 2020-01-17 DIAGNOSIS — M21612 Bunion of left foot: Secondary | ICD-10-CM | POA: Diagnosis not present

## 2020-01-17 DIAGNOSIS — Z886 Allergy status to analgesic agent status: Secondary | ICD-10-CM | POA: Insufficient documentation

## 2020-01-17 DIAGNOSIS — Z8614 Personal history of Methicillin resistant Staphylococcus aureus infection: Secondary | ICD-10-CM | POA: Diagnosis not present

## 2020-01-17 DIAGNOSIS — M1990 Unspecified osteoarthritis, unspecified site: Secondary | ICD-10-CM | POA: Diagnosis not present

## 2020-01-17 DIAGNOSIS — M898X7 Other specified disorders of bone, ankle and foot: Secondary | ICD-10-CM | POA: Insufficient documentation

## 2020-01-17 DIAGNOSIS — Z79899 Other long term (current) drug therapy: Secondary | ICD-10-CM | POA: Diagnosis not present

## 2020-01-17 DIAGNOSIS — Z888 Allergy status to other drugs, medicaments and biological substances status: Secondary | ICD-10-CM | POA: Insufficient documentation

## 2020-01-17 DIAGNOSIS — Z79890 Hormone replacement therapy (postmenopausal): Secondary | ICD-10-CM | POA: Insufficient documentation

## 2020-01-17 DIAGNOSIS — L97429 Non-pressure chronic ulcer of left heel and midfoot with unspecified severity: Secondary | ICD-10-CM | POA: Insufficient documentation

## 2020-01-17 DIAGNOSIS — Z880 Allergy status to penicillin: Secondary | ICD-10-CM | POA: Diagnosis not present

## 2020-01-17 HISTORY — PX: BUNIONECTOMY: SHX129

## 2020-01-17 SURGERY — BUNIONECTOMY
Anesthesia: General | Site: Toe | Laterality: Left

## 2020-01-17 MED ORDER — MIDAZOLAM HCL 2 MG/2ML IJ SOLN
INTRAMUSCULAR | Status: DC | PRN
  Administered 2020-01-17: 2 mg via INTRAVENOUS

## 2020-01-17 MED ORDER — GLYCOPYRROLATE 0.2 MG/ML IJ SOLN
INTRAMUSCULAR | Status: DC | PRN
  Administered 2020-01-17: .2 mg via INTRAVENOUS

## 2020-01-17 MED ORDER — ONDANSETRON HCL 4 MG/2ML IJ SOLN
INTRAMUSCULAR | Status: DC | PRN
  Administered 2020-01-17: 4 mg via INTRAVENOUS

## 2020-01-17 MED ORDER — OXYCODONE HCL 5 MG PO TABS: 5.0000 mg | ORAL_TABLET | Freq: Once | ORAL | Status: DC | PRN

## 2020-01-17 MED ORDER — LACTATED RINGERS IV SOLN: INTRAVENOUS | Status: DC

## 2020-01-17 MED ORDER — ONDANSETRON HCL 4 MG PO TABS: 4.0000 mg | ORAL_TABLET | Freq: Four times a day (QID) | ORAL | Status: DC | PRN

## 2020-01-17 MED ORDER — VANCOMYCIN HCL IN DEXTROSE 1-5 GM/200ML-% IV SOLN
INTRAVENOUS | Status: AC
  Administered 2020-01-17: 1000 mg via INTRAVENOUS
  Filled 2020-01-17: qty 200

## 2020-01-17 MED ORDER — BUPIVACAINE LIPOSOME 1.3 % IJ SUSP
INTRAMUSCULAR | Status: DC | PRN
  Administered 2020-01-17: 5 mL

## 2020-01-17 MED ORDER — PROMETHAZINE HCL 25 MG/ML IJ SOLN: 6.2500 mg | INTRAMUSCULAR | Status: DC | PRN

## 2020-01-17 MED ORDER — CHLORHEXIDINE GLUCONATE 0.12 % MT SOLN
OROMUCOSAL | Status: AC
  Administered 2020-01-17: 15 mL via OROMUCOSAL
  Filled 2020-01-17: qty 15

## 2020-01-17 MED ORDER — CHLORHEXIDINE GLUCONATE 0.12 % MT SOLN: 15.0000 mL | Freq: Once | OROMUCOSAL | Status: AC

## 2020-01-17 MED ORDER — VANCOMYCIN HCL IN DEXTROSE 1-5 GM/200ML-% IV SOLN: 1000.0000 mg | INTRAVENOUS | Status: AC

## 2020-01-17 MED ORDER — PROPOFOL 10 MG/ML IV BOLUS
INTRAVENOUS | Status: AC
  Filled 2020-01-17: qty 60

## 2020-01-17 MED ORDER — OXYCODONE HCL 5 MG/5ML PO SOLN: 5.0000 mg | Freq: Once | ORAL | Status: DC | PRN

## 2020-01-17 MED ORDER — BUPIVACAINE HCL (PF) 0.5 % IJ SOLN
INTRAMUSCULAR | Status: DC | PRN
  Administered 2020-01-17: 5 mL

## 2020-01-17 MED ORDER — PHENYLEPHRINE HCL (PRESSORS) 10 MG/ML IV SOLN
INTRAVENOUS | Status: DC | PRN
  Administered 2020-01-17 (×2): 100 ug via INTRAVENOUS
  Administered 2020-01-17: 200 ug via INTRAVENOUS
  Administered 2020-01-17 (×3): 100 ug via INTRAVENOUS
  Administered 2020-01-17: 150 ug via INTRAVENOUS
  Administered 2020-01-17 (×3): 100 ug via INTRAVENOUS

## 2020-01-17 MED ORDER — DEXAMETHASONE SODIUM PHOSPHATE 10 MG/ML IJ SOLN
INTRAMUSCULAR | Status: DC | PRN
  Administered 2020-01-17: 5 mg via INTRAVENOUS

## 2020-01-17 MED ORDER — FENTANYL CITRATE (PF) 100 MCG/2ML IJ SOLN: 25.0000 ug | INTRAMUSCULAR | Status: DC | PRN

## 2020-01-17 MED ORDER — ONDANSETRON HCL 4 MG/2ML IJ SOLN: 4.0000 mg | Freq: Four times a day (QID) | INTRAMUSCULAR | Status: DC | PRN

## 2020-01-17 MED ORDER — ORAL CARE MOUTH RINSE: 15.0000 mL | Freq: Once | OROMUCOSAL | Status: AC

## 2020-01-17 MED ORDER — FENTANYL CITRATE (PF) 100 MCG/2ML IJ SOLN
INTRAMUSCULAR | Status: DC | PRN
Start: 2020-01-17 — End: 2020-01-17
  Administered 2020-01-17: 50 ug via INTRAVENOUS

## 2020-01-17 MED ORDER — LIDOCAINE HCL (CARDIAC) PF 100 MG/5ML IV SOSY
PREFILLED_SYRINGE | INTRAVENOUS | Status: DC | PRN
  Administered 2020-01-17: 80 mg via INTRAVENOUS

## 2020-01-17 MED ORDER — PROPOFOL 10 MG/ML IV BOLUS
INTRAVENOUS | Status: DC | PRN
  Administered 2020-01-17: 150 mg via INTRAVENOUS

## 2020-01-17 MED ORDER — FENTANYL CITRATE (PF) 100 MCG/2ML IJ SOLN
INTRAMUSCULAR | Status: AC
  Filled 2020-01-17: qty 2

## 2020-01-17 MED ORDER — MIDAZOLAM HCL 2 MG/2ML IJ SOLN
INTRAMUSCULAR | Status: AC
  Filled 2020-01-17: qty 2

## 2020-01-17 MED ORDER — POVIDONE-IODINE 7.5 % EX SOLN
Freq: Once | CUTANEOUS | Status: DC
  Filled 2020-01-17: qty 118

## 2020-01-17 MED ORDER — OXYCODONE-ACETAMINOPHEN 5-325 MG PO TABS: 1.0000 | ORAL_TABLET | Freq: Four times a day (QID) | ORAL | 0 refills | Status: DC | PRN

## 2020-01-17 MED ORDER — ACETAMINOPHEN 10 MG/ML IV SOLN
INTRAVENOUS | Status: AC
  Filled 2020-01-17: qty 100

## 2020-01-17 MED ORDER — MEPERIDINE HCL 50 MG/ML IJ SOLN: 6.2500 mg | INTRAMUSCULAR | Status: DC | PRN

## 2020-01-17 MED ORDER — ACETAMINOPHEN 10 MG/ML IV SOLN
INTRAVENOUS | Status: DC | PRN
  Administered 2020-01-17: 1000 mg via INTRAVENOUS

## 2020-01-17 SURGICAL SUPPLY — 56 items
APL SKNCLS STERI-STRIP NONHPOA (GAUZE/BANDAGES/DRESSINGS) ×1
BENZOIN TINCTURE PRP APPL 2/3 (GAUZE/BANDAGES/DRESSINGS) ×1 IMPLANT
BLADE MED AGGRESSIVE (BLADE) ×2 IMPLANT
BLADE OSC/SAGITTAL MD 5.5X18 (BLADE) ×2 IMPLANT
BLADE SURG 15 STRL LF DISP TIS (BLADE) ×2 IMPLANT
BLADE SURG 15 STRL SS (BLADE) ×4
BLADE SURG MINI STRL (BLADE) ×2 IMPLANT
BNDG CMPR STD VLCR NS LF 5.8X4 (GAUZE/BANDAGES/DRESSINGS) ×1
BNDG CMPR STD VLCR NS LF 5.8X6 (GAUZE/BANDAGES/DRESSINGS) ×2
BNDG CONFORM 3 STRL LF (GAUZE/BANDAGES/DRESSINGS) ×2 IMPLANT
BNDG ELASTIC 4X5.8 VLCR NS LF (GAUZE/BANDAGES/DRESSINGS) ×2 IMPLANT
BNDG ELASTIC 6X5.8 VLCR NS LF (GAUZE/BANDAGES/DRESSINGS) ×4 IMPLANT
BNDG ESMARK 4X12 TAN STRL LF (GAUZE/BANDAGES/DRESSINGS) ×2 IMPLANT
CANISTER SUCT 1200ML W/VALVE (MISCELLANEOUS) ×2 IMPLANT
COVER WAND RF STERILE (DRAPES) ×2 IMPLANT
CUFF TOURN SGL QUICK 12 (TOURNIQUET CUFF) IMPLANT
CUFF TOURN SGL QUICK 18X4 (TOURNIQUET CUFF) IMPLANT
DRAPE FLUOR MINI C-ARM 54X84 (DRAPES) ×2 IMPLANT
DRSG TELFA 4X3 1S NADH ST (GAUZE/BANDAGES/DRESSINGS) ×1 IMPLANT
DURAPREP 26ML APPLICATOR (WOUND CARE) ×2 IMPLANT
ELECT REM PT RETURN 9FT ADLT (ELECTROSURGICAL) ×2
ELECTRODE REM PT RTRN 9FT ADLT (ELECTROSURGICAL) ×1 IMPLANT
GAUZE 4X4 16PLY RFD (DISPOSABLE) ×2 IMPLANT
GAUZE SPONGE 4X4 12PLY STRL (GAUZE/BANDAGES/DRESSINGS) ×2 IMPLANT
GAUZE XEROFORM 1X8 LF (GAUZE/BANDAGES/DRESSINGS) ×2 IMPLANT
GLOVE BIO SURGEON STRL SZ7.5 (GLOVE) ×2 IMPLANT
GLOVE INDICATOR 8.0 STRL GRN (GLOVE) ×2 IMPLANT
GOWN STRL REUS W/ TWL XL LVL3 (GOWN DISPOSABLE) ×2 IMPLANT
GOWN STRL REUS W/TWL XL LVL3 (GOWN DISPOSABLE) ×4
K wire threaded ×1 IMPLANT
NDL FILTER BLUNT 18X1 1/2 (NEEDLE) ×1 IMPLANT
NEEDLE FILTER BLUNT 18X 1/2SAF (NEEDLE) ×1
NEEDLE FILTER BLUNT 18X1 1/2 (NEEDLE) ×1 IMPLANT
NEEDLE HYPO 22GX1.5 SAFETY (NEEDLE) ×2 IMPLANT
NS IRRIG 500ML POUR BTL (IV SOLUTION) ×2 IMPLANT
PACK EXTREMITY (MISCELLANEOUS) ×2 IMPLANT
PAD CAST CTTN 4X4 STRL (SOFTGOODS) ×1 IMPLANT
PAD PREP 24X41 OB/GYN DISP (PERSONAL CARE ITEMS) ×2 IMPLANT
PADDING CAST COTTON 4X4 STRL (SOFTGOODS) ×2
PENCIL ELECTRO HAND CTR (MISCELLANEOUS) ×2 IMPLANT
PIN BALLS 3/8 F/.054-.062 WIRE (MISCELLANEOUS) ×2 IMPLANT
RASP SM TEAR CROSS CUT (RASP) ×2 IMPLANT
STOCKINETTE STRL 6IN 960660 (GAUZE/BANDAGES/DRESSINGS) ×2 IMPLANT
STRAP SAFETY 5IN WIDE (MISCELLANEOUS) ×2 IMPLANT
STRIP CLOSURE SKIN 1/4X4 (GAUZE/BANDAGES/DRESSINGS) ×2 IMPLANT
SUT ETHILON 5-0 FS-2 18 BLK (SUTURE) ×2 IMPLANT
SUT MNCRL AB 4-0 PS2 18 (SUTURE) ×1 IMPLANT
SUT VIC AB 3-0 SH 27 (SUTURE) ×2
SUT VIC AB 3-0 SH 27X BRD (SUTURE) IMPLANT
SUT VIC AB 4-0 FS2 27 (SUTURE) ×2 IMPLANT
SUT VIC AB 4-0 RB1 27 (SUTURE) ×2
SUT VIC AB 4-0 RB1 27X BRD (SUTURE) ×1 IMPLANT
SYR 10ML LL (SYRINGE) ×2 IMPLANT
SYR 50ML LL SCALE MARK (SYRINGE) ×2 IMPLANT
WIRE Z .045 C-WIRE SPADE TIP (WIRE) ×5 IMPLANT
WIRE Z .062 C-WIRE SPADE TIP (WIRE) ×4 IMPLANT

## 2020-01-17 NOTE — H&P (Signed)
HISTORY AND PHYSICAL INTERVAL NOTE:  01/17/2020  11:34 AM  Christine Schroeder  has presented today for surgery, with the diagnosis of M21.622  TAILOR'S BUNION LEFT FOOT.  The various methods of treatment have been discussed with the patient.  No guarantees were given.  After consideration of risks, benefits and other options for treatment, the patient has consented to surgery.  I have reviewed the patients' chart and labs.     Schroeder history and physical examination was performed in my office.  The patient was reexamined.  There have been no changes to this history and physical examination.  Christine Schroeder

## 2020-01-17 NOTE — Discharge Instructions (Addendum)
[place] DR. TROXLER, DR. Vickki Muff, AND DR. [place]   1. Take your medication as prescribed.  Pain medication should be taken only as needed.  2. Keep the dressing clean, dry and intact.  3. Keep your foot elevated above the heart level for the first 48 hours.  4. Walking to the bathroom and brief periods of walking are acceptable, unless we have instructed you to be non-weight bearing.  5. Always wear your post-op shoe when walking.  Always use your crutches if you are to be non-weight bearing.  6. Do not take a shower. Baths are permissible as long as the foot is kept out of the water.   7. Every hour you are awake:  - Bend your knee 15 times. - Flex foot 15 times - Massage calf 15 times  8. Call [facility] [phone] if any of the following problems occur: - You develop a temperature or fever. - The bandage becomes saturated with blood. - Medication does not stop your pain. - Injury of the foot occurs. - Any symptoms of infection including redness, odor, or red streaks running from wound.   Bupivacaine Liposomal Suspension for Injection What is this medicine? BUPIVACAINE LIPOSOMAL (bue PIV a kane LIP oh som al) is an anesthetic. It causes loss of feeling in the skin or other tissues. It is used to prevent and to treat pain from some procedures. This medicine may be used for other purposes; ask your health care provider or pharmacist if you have questions. COMMON BRAND NAME(S): EXPAREL What should I tell my health care provider before I take this medicine? They need to know if you have any of these conditions:  G6PD deficiency  heart disease  kidney disease  liver disease  low blood pressure  lung or breathing disease, like asthma  an unusual or allergic reaction to bupivacaine, other medicines, foods, dyes, or preservatives  pregnant or  trying to get pregnant  breast-feeding How should I use this medicine? This medicine is for injection into the affected area. It is given by a health care professional in a hospital or clinic setting. Talk to your pediatrician regarding the use of this medicine in children. Special care may be needed. Overdosage: If you think you have taken too much of this medicine contact a poison control center or emergency room at once. NOTE: This medicine is only for you. Do not share this medicine with others. What if I miss a dose? This does not apply. What may interact with this medicine? This medicine may interact with the following medications:  acetaminophen  certain antibiotics like dapsone, nitrofurantoin, aminosalicylic acid, sulfonamides  certain medicines for seizures like phenobarbital, phenytoin, valproic acid  chloroquine  cyclophosphamide  flutamide  hydroxyurea  ifosfamide  metoclopramide  nitric oxide  nitroglycerin  nitroprusside  nitrous oxide  other local anesthetics like lidocaine, pramoxine, tetracaine  primaquine  quinine  rasburicase  sulfasalazine This list may not describe all possible interactions. Give your health care provider a list of all the medicines, herbs, non-prescription drugs, or dietary supplements you use. Also tell them if you smoke, drink alcohol, or use illegal drugs. Some items may interact with your medicine. What should I watch for while using this medicine? Your condition will be monitored carefully while you are receiving this medicine. Be careful to avoid injury while the area is numb, and you are not aware of pain. What side effects  may I notice from receiving this medicine? Side effects that you should report to your doctor or health care professional as soon as possible:  allergic reactions like skin rash, itching or hives, swelling of the face, lips, or tongue  seizures  signs and symptoms of a dangerous change in  heartbeat or heart rhythm like chest pain; dizziness; fast, irregular heartbeat; palpitations; feeling faint or lightheaded; falls; breathing problems  signs and symptoms of methemoglobinemia such as pale, gray, or blue colored skin; headache; fast heartbeat; shortness of breath; feeling faint or lightheaded, falls; tiredness Side effects that usually do not require medical attention (report to your doctor or health care professional if they continue or are bothersome):  anxious  back pain  changes in taste  changes in vision  constipation  dizziness  fever  nausea, vomiting This list may not describe all possible side effects. Call your doctor for medical advice about side effects. You may report side effects to FDA at 1-800-FDA-1088. Where should I keep my medicine? This drug is given in a hospital or clinic and will not be stored at home. NOTE: This sheet is a summary. It may not cover all possible information. If you have questions about this medicine, talk to your doctor, pharmacist, or health care provider.  2020 Elsevier/Gold Standard (2019-01-08 10:48:23)   AMBULATORY SURGERY  DISCHARGE INSTRUCTIONS   1) The drugs that you were given will stay in your system until tomorrow so for the next 24 hours you should not:  A) Drive an automobile B) Make any legal decisions C) Drink any alcoholic beverage   2) You may resume regular meals tomorrow.  Today it is better to start with liquids and gradually work up to solid foods.  You may eat anything you prefer, but it is better to start with liquids, then soup and crackers, and gradually work up to solid foods.   3) Please notify your doctor immediately if you have any unusual bleeding, trouble breathing, redness and pain at the surgery site, drainage, fever, or pain not relieved by medication.    4) Additional Instructions:        Please contact your physician with any problems or Same Day Surgery at  [phone], Monday through Friday 6 am to 4 pm, or [place] at [facility] number at [phone].

## 2020-01-17 NOTE — Op Note (Signed)
Operative note   Surgeon:Corazon Nickolas Lawyer: None    Preop diagnosis: 1. Tailor's bunion left fifth MTPJ 2. Ulcer left plantar fifth MTPJ    Postop diagnosis: Same    Procedure: 1. Z osteotomy tailor's bunionectomy left foot 2. Full-thickness excisional debridement ulcer plantar left fifth MTPJ to capsule.  3. Use of intraoperative fluoroscopy without need for radiology interpretation    EBL: Minimal    Anesthesia:local and general    Hemostasis: Ankle tourniquet inflated to 200 mmHg for approximately 50 minutes    Specimen: Bone for specimen and culture left fifth MTPJ    Complications: None    Operative indications:Christine Schroeder is an 54 y.o. that presents today for surgical intervention.  The risks/benefits/alternatives/complications have been discussed and consent has been given.    Procedure:  Patient was brought into the OR and placed on the operating table in thesupine position. After anesthesia was obtained theleft lower extremity was prepped and draped in usual sterile fashion.  Attention was directed to the dorsal lateral aspect of the left foot where a dorsal lateral incision was performed. Sharp and blunt dissection carried down to the capsule. Next a T capsulotomy was then performed. The prominent lateral aspect of the fifth metatarsal head was noted. Next the ensuing cuts for the Z osteotomy were created. The capital fragment was translocated medially. This was stabilized with a 0.045 threaded K wire. The K wire was cut flush against the dorsal bone. The ensuing overhanging ledge was then transected. A small portion was sent for pathology and the remainder sent for culture. The wound was flushed with copious amounts of irrigation. No obvious infection was noted throughout the procedure. The joint capsule was well maintained and intact. The bone itself was well maintained intact. The cartilaginous head and base of the proximal phalanx were intact as well. Closure of  the capsule was performed with a 4-0 Vicryl and a small capsulorrhaphy was performed laterally. The skin was then closed with 4-0 Monocryl.   Attention was directed to the plantar lateral aspect of the fifth metatarsal head where the full-thickness ulceration was noted. Predebridement measurements showed an ulcer that measured approximately 4 mm in diameter. It had a depth of approximately 4 mm. No obvious infection was noted. No purulence or surrounding erythema was noted from the wound. Full-thickness excisional debridement was performed and all of the ulceration was debrided away completely down to healthy bleeding bone to the level of capsule. This wound was then flushed with copious amounts of irrigation. 4-0 Monocryl was used for the subcutaneous tissue for closure in this area. A bulky sterile dressing was applied overlying the foot.  Intraoperative fluoroscopy was used throughout the entire procedure to evaluate both preosteotomy and post osteotomy as well as placement of K wire.    Patient tolerated the procedure and anesthesia well.  Was transported from the OR to the PACU with all vital signs stable and vascular status intact. To be discharged per routine protocol.  Will follow up in approximately 1 week in the outpatient clinic.

## 2020-01-17 NOTE — Anesthesia Procedure Notes (Signed)
Procedure Name: LMA Insertion Date/Time: 01/17/2020 11:50 AM Performed by: Lowry Bowl, CRNA Pre-anesthesia Checklist: Patient identified, Suction available, Patient being monitored and Emergency Drugs available Patient Re-evaluated:Patient Re-evaluated prior to induction Oxygen Delivery Method: Circle system utilized Preoxygenation: Pre-oxygenation with 100% oxygen Induction Type: IV induction Ventilation: Mask ventilation without difficulty LMA: LMA inserted LMA Size: 4.0 Number of attempts: 1 Placement Confirmation: positive ETCO2 and breath sounds checked- equal and bilateral Tube secured with: Tape Dental Injury: Teeth and Oropharynx as per pre-operative assessment

## 2020-01-17 NOTE — OR Nursing (Signed)
Dr. Vickki Muff in to see pt in postop just prior to discharge.

## 2020-01-17 NOTE — Anesthesia Preprocedure Evaluation (Signed)
Anesthesia Evaluation  Patient identified by MRN, date of birth, ID band Patient awake    Reviewed: Allergy & Precautions, NPO status , Patient's Chart, lab work & pertinent test results  History of Anesthesia Complications Negative for: history of anesthetic complications  Airway Mallampati: II  TM Distance: >3 FB Neck ROM: Full    Dental no notable dental hx.    Pulmonary neg pulmonary ROS, neg sleep apnea, neg COPD,    breath sounds clear to auscultation- rhonchi (-) wheezing      Cardiovascular Exercise Tolerance: Good (-) hypertension(-) CAD, (-) Past MI, (-) Cardiac Stents and (-) CABG  Rhythm:Regular Rate:Normal - Systolic murmurs and - Diastolic murmurs    Neuro/Psych neg Seizures negative neurological ROS  negative psych ROS   GI/Hepatic Neg liver ROS, GERD  ,  Endo/Other  negative endocrine ROSneg diabetes  Renal/GU negative Renal ROS     Musculoskeletal  (+) Arthritis ,   Abdominal (+) - obese,   Peds  Hematology negative hematology ROS (+)   Anesthesia Other Findings Past Medical History: No date: Allergic rhinitis No date: Chickenpox No date: Family history of adverse reaction to anesthesia     Comment:  Mother - PONV No date: GERD (gastroesophageal reflux disease) 09/2019: History of MRSA infection     Comment:  MRSA infection of foot after cortisol injection. No date: Increased BMI   Reproductive/Obstetrics                             Anesthesia Physical Anesthesia Plan  ASA: II  Anesthesia Plan: General   Post-op Pain Management:    Induction: Intravenous  PONV Risk Score and Plan: 2 and Ondansetron, Dexamethasone and Midazolam  Airway Management Planned: LMA  Additional Equipment:   Intra-op Plan:   Post-operative Plan:   Informed Consent: I have reviewed the patients History and Physical, chart, labs and discussed the procedure including the risks,  benefits and alternatives for the proposed anesthesia with the patient or authorized representative who has indicated his/her understanding and acceptance.     Dental advisory given  Plan Discussed with: CRNA and Anesthesiologist  Anesthesia Plan Comments:         Anesthesia Quick Evaluation

## 2020-01-17 NOTE — Anesthesia Postprocedure Evaluation (Signed)
Anesthesia Post Note  Patient: Christine Schroeder  Procedure(s) Performed: Lillard Anes TAILOR'S (Left Toe)  Patient location during evaluation: PACU Anesthesia Type: General Level of consciousness: awake and alert and oriented Pain management: pain level controlled Vital Signs Assessment: post-procedure vital signs reviewed and stable Respiratory status: spontaneous breathing, nonlabored ventilation and respiratory function stable Cardiovascular status: blood pressure returned to baseline and stable Postop Assessment: no signs of nausea or vomiting Anesthetic complications: no   No complications documented.   Last Vitals:  Vitals:   01/17/20 1349 01/17/20 1411  BP: 122/68 129/70  Pulse: 71 66  Resp: 16 16  Temp: 36.9 C   SpO2: 100% 99%    Last Pain:  Vitals:   01/17/20 1411  TempSrc:   PainSc: 0-No pain                 Eva Griffo

## 2020-01-17 NOTE — Transfer of Care (Signed)
Immediate Anesthesia Transfer of Care Note  Patient: Christine Schroeder  Procedure(s) Performed: Lillard Anes TAILOR'S (Left Toe)  Patient Location: PACU  Anesthesia Type:General  Level of Consciousness: awake, alert  and oriented  Airway & Oxygen Therapy: Patient Spontanous Breathing and Patient connected to face mask oxygen  Post-op Assessment: Report given to RN and Post -op Vital signs reviewed and stable  Post vital signs: Reviewed and stable  Last Vitals:  Vitals Value Taken Time  BP 106/89 01/17/20 1309  Temp    Pulse 82 01/17/20 1313  Resp 19 01/17/20 1313  SpO2 100 % 01/17/20 1313  Vitals shown include unvalidated device data.  Last Pain:  Vitals:   01/17/20 1311  TempSrc:   PainSc: (P) Asleep         Complications: No complications documented.

## 2020-01-20 ENCOUNTER — Other Ambulatory Visit: Payer: Self-pay | Admitting: Obstetrics and Gynecology

## 2020-01-20 ENCOUNTER — Encounter: Payer: Self-pay | Admitting: Podiatry

## 2020-01-20 LAB — SURGICAL PATHOLOGY

## 2020-01-23 LAB — AEROBIC/ANAEROBIC CULTURE W GRAM STAIN (SURGICAL/DEEP WOUND)
Culture: NO GROWTH
Gram Stain: NONE SEEN

## 2020-01-27 ENCOUNTER — Telehealth: Payer: Self-pay

## 2020-01-27 NOTE — Telephone Encounter (Signed)
Patient called in stating that she is experiencing a rash around/near her vaginal area. Patient believes it is due from medication that she is on. Patient would like to be seen today, informed patient that her provider is out of the office all this week and that I could send a message back to her providers nurse to see what we could do for this patient.   Could you please advise?

## 2020-01-28 NOTE — Telephone Encounter (Signed)
Please see my chart messages

## 2020-01-29 ENCOUNTER — Encounter: Payer: Self-pay | Admitting: Obstetrics and Gynecology

## 2020-01-29 ENCOUNTER — Ambulatory Visit (INDEPENDENT_AMBULATORY_CARE_PROVIDER_SITE_OTHER): Payer: Managed Care, Other (non HMO) | Admitting: Obstetrics and Gynecology

## 2020-01-29 ENCOUNTER — Other Ambulatory Visit: Payer: Self-pay

## 2020-01-29 VITALS — BP 136/83 | HR 73 | Ht 63.0 in | Wt 159.0 lb

## 2020-01-29 DIAGNOSIS — B3731 Acute candidiasis of vulva and vagina: Secondary | ICD-10-CM

## 2020-01-29 DIAGNOSIS — B373 Candidiasis of vulva and vagina: Secondary | ICD-10-CM

## 2020-01-29 MED ORDER — FLUCONAZOLE 150 MG PO TABS: 150.0000 mg | ORAL_TABLET | ORAL | 0 refills | Status: DC

## 2020-01-29 NOTE — Progress Notes (Signed)
HPI:      [name] is a 54 y.o. G0P0000 who LMP was Patient's last menstrual period was 07/12/2016.  Subjective:   She presents today because she has a rash in her groin area and has had vaginal itching.  She denies significant vaginal discharge. She has had issues with her foot and has had surgery, MRSA, and been on and off antibiotics for the last 5 months.  She is now doing better and is no longer on antibiotics. She has tried over-the-counter Monistat for the last 2 nights without success.   Hx: The following portions of the patient's history were reviewed and updated as appropriate:             She  has a past medical history of Allergic rhinitis, Chickenpox, Family history of adverse reaction to anesthesia, GERD (gastroesophageal reflux disease), History of MRSA infection (09/2019), and Increased BMI. She does not have any pertinent problems on file. She  has a past surgical history that includes No past surgeries; Shoulder arthroscopy (Right, 02/10/2016); Colonoscopy with propofol (N/A, 11/03/2017); Irrigation and debridement foot (Left, 12/08/2019); and Bunionectomy (Left, 01/17/2020). Her family history includes Colon cancer in her maternal aunt; Diabetes in her maternal grandfather and maternal grandmother; Heart disease in her maternal uncle; Hypertension in her mother; Stroke in her maternal grandmother. She  reports that she has never smoked. She has never used smokeless tobacco. She reports previous alcohol use. She reports that she does not use drugs. She has a current medication list which includes the following prescription(s): acetaminophen, cetirizine, diphenhydramine, esomeprazole, estradiol, fluconazole, medroxyprogesterone, multiple vitamins-minerals, mupirocin ointment, oxycodone-acetaminophen, and premarin. She is allergic to penicillins, etodolac, meloxicam, and prednisone.       Review of Systems:  Review of Systems  Constitutional: Denied constitutional  symptoms, night sweats, recent illness, fatigue, fever, insomnia and weight loss.  Eyes: Denied eye symptoms, eye pain, photophobia, vision change and visual disturbance.  Ears/Nose/Throat/Neck: Denied ear, nose, throat or neck symptoms, hearing loss, nasal discharge, sinus congestion and sore throat.  Cardiovascular: Denied cardiovascular symptoms, arrhythmia, chest pain/pressure, edema, exercise intolerance, orthopnea and palpitations.  Respiratory: Denied pulmonary symptoms, asthma, pleuritic pain, productive sputum, cough, dyspnea and wheezing.  Gastrointestinal: Denied, gastro-esophageal reflux, melena, nausea and vomiting.  Genitourinary: See HPI for additional information.  Musculoskeletal: Denied musculoskeletal symptoms, stiffness, swelling, muscle weakness and myalgia.  Dermatologic: Denied dermatology symptoms, rash and scar.  Neurologic: Denied neurology symptoms, dizziness, headache, neck pain and syncope.  Psychiatric: Denied psychiatric symptoms, anxiety and depression.  Endocrine: Denied endocrine symptoms including hot flashes and night sweats.   Meds:   Current Outpatient Medications on File Prior to Visit  Medication Sig Dispense Refill  . acetaminophen (TYLENOL) 500 MG tablet Take 1,000 mg by mouth every 6 (six) hours as needed (pain.).    Marland Kitchen cetirizine (ZYRTEC) 10 MG tablet Take 10 mg by mouth daily.     . diphenhydrAMINE (BENADRYL) 25 mg capsule Take 25 mg by mouth daily as needed for allergies.     Marland Kitchen esomeprazole (NEXIUM) 20 MG capsule Take 20 mg by mouth daily at 12 noon.    Marland Kitchen estradiol (ESTRACE) 1 MG tablet Take 1 tablet (1 mg total) by mouth daily. 90 tablet 3  . medroxyPROGESTERone (PROVERA) 2.5 MG tablet Take 1 tablet (2.5 mg total) by mouth daily. 90 tablet 3  . Multiple Vitamins-Minerals (HAIR SKIN NAILS PO) Take 2 tablets by mouth daily. Hair/Skin/Nails by VitaFusion    . mupirocin ointment (BACTROBAN) 2 % Apply  1 application topically daily.    Marland Kitchen  oxyCODONE-acetaminophen (PERCOCET) 5-325 MG tablet Take 1-2 tablets by mouth every 6 (six) hours as needed for severe pain. 20 tablet 0  . PREMARIN 0.625 MG tablet TAKE 1 TABLET(0.625 MG) BY MOUTH DAILY 90 tablet 0   No current facility-administered medications on file prior to visit.          Objective:     Vitals:   01/29/20 0949  BP: 136/83  Pulse: 73   Filed Weights   01/29/20 0949  Weight: 159 lb (72.1 kg)              Physical examination   Pelvic:  Vulva:  Inner thighs reveal erythematous rash , some rash also noted on vulva.  No lesions.  Vagina: No lesions or abnormalities noted.  Support: Normal pelvic support.  Urethra No masses tenderness or scarring.  Meatus Normal size without lesions or prolapse.  Cervix: Normal appearance.  No lesions.  Anus: Normal exam.  No lesions.  Perineum: Normal exam.  No lesions.   WET PREP: clue cells: absent, KOH (yeast): positive, odor: absent and trichomoniasis: negative Ph:  < 4.5   Assessment:    G0P0000 Patient Active Problem List   Diagnosis Date Noted  . Septic arthritis of interphalangeal joint of toe of left foot ([place]) 12/10/2019  . Left foot infection 12/08/2019  . Osteomyelitis ([place]) 12/08/2019  . History of MRSA infection 09/2019  . Visual changes 01/07/2019  . BPV (benign positional vertigo) 02/22/2018  . Vasomotor symptoms due to menopause 09/14/2016  . Preventative health care 03/27/2015  . Borderline diabetes 03/27/2015  . Palpitations 02/04/2015  . Allergic rhinitis 02/04/2015  . GERD (gastroesophageal reflux disease) 02/04/2015     1. Monilial vulvovaginitis     Likely secondary to prolonged antibiotic use.   Plan:            1.  Diflucan for 3 doses.  Expect rapid resolution. Orders No orders of the defined types were placed in this encounter.    Meds ordered this encounter  Medications  . fluconazole (DIFLUCAN) 150 MG tablet    Sig: Take 1 tablet (150 mg total) by mouth as  directed. Take one pill today then Saturday then 1 week later    Dispense:  3 tablet    Refill:  0      F/U  No follow-ups on file. I spent 23 minutes involved in the care of this patient preparing to see the patient by obtaining and reviewing her medical history (including labs, imaging tests and prior procedures), documenting clinical information in the electronic health record (EHR), counseling and coordinating care plans, writing and sending prescriptions, ordering tests or procedures and directly communicating with the patient by discussing pertinent items from her history and physical exam as well as detailing my assessment and plan as noted above so that she has an informed understanding.  All of her questions were answered.  Finis Bud, M.D. 01/29/2020 10:29 AM

## 2020-04-21 ENCOUNTER — Other Ambulatory Visit: Payer: Self-pay | Admitting: Obstetrics and Gynecology

## 2020-09-28 ENCOUNTER — Other Ambulatory Visit: Payer: Self-pay | Admitting: Obstetrics and Gynecology

## 2020-11-27 ENCOUNTER — Encounter: Payer: Managed Care, Other (non HMO) | Admitting: Obstetrics and Gynecology

## 2020-12-01 NOTE — Progress Notes (Signed)
GYNECOLOGY ANNUAL PHYSICAL EXAM PROGRESS NOTE  Subjective:   HPI Christine Schroeder is a 55 y.o. G0P0000 female here for a routine annual gynecologic exam. The patient is sexually active. She reports using hormone replacement therapy, has been on therapy for ~  4 years. Patient denies post-menopausal vaginal bleeding. The patient wears seatbelts: yes. The patient participates in regular exercise: yes (walking daily). Has the patient ever been transfused or tattooed?: no. The patient reports that there is not domestic violence in her life.  Chief Complaints: She has no new complaints today.  Desires refill on Estradiol.   Gynecologic History Patient's last menstrual period was 07/12/2016.  She is postmenopausal.  Contraception: post menopausal status Last Pap: 07/12/2017. Results were: normal.  Last mammogram: 7/11/ 2022. Results were: normal and have been scanned into chart. Last Colonoscopy: 11/03/2017.  Results were colon polyps. For repeat in 5 years.     OB History  Gravida Para Term Preterm AB Living  0 0 0 0 0 0  SAB IAB Ectopic Multiple Live Births  0 0 0 0 0    Past Medical History:  Diagnosis Date   Allergic rhinitis    Chickenpox    Family history of adverse reaction to anesthesia    Mother - PONV   GERD (gastroesophageal reflux disease)    History of MRSA infection 09/2019   MRSA infection of foot after cortisol injection.   Increased BMI     Past Surgical History:  Procedure Laterality Date   BUNIONECTOMY Left 01/17/2020   Procedure: BUNIONECTOMY TAILOR'S;  Surgeon: Samara Deist, DPM;  Location: [facility] ORS;  Service: Podiatry;  Laterality: Left;   COLONOSCOPY WITH PROPOFOL N/A 11/03/2017   Procedure: COLONOSCOPY WITH PROPOFOL;  Surgeon: Jonathon Bellows, MD;  Location: [facility];  Service: Endoscopy;  Laterality: N/A;   IRRIGATION AND DEBRIDEMENT FOOT Left 12/08/2019   Procedure: IRRIGATION AND DEBRIDEMENT FOOT;  Surgeon: Samara Deist, DPM;  Location: [facility] ORS;   Service: Podiatry;  Laterality: Left;   NO PAST SURGERIES     SHOULDER ARTHROSCOPY Right 02/10/2016   Procedure: Limited arthroscopic debridement, arthroscopic SLAP tear, subacromial decompression, mini-open rotator cuff repair, and mini-open biceps tenodesis, right shoulder;  Surgeon: Corky Mull, MD;  Location: [place];  Service: Orthopedics;  Laterality: Right;    Family History  Problem Relation Age of Onset   Hypertension Mother    Heart disease Maternal Uncle    Stroke Maternal Grandmother    Diabetes Maternal Grandmother    Diabetes Maternal Grandfather    Colon cancer Maternal Aunt    Cancer Neg Hx     Social History   Socioeconomic History   Marital status: Married    Spouse name: Not on file   Number of children: Not on file   Years of education: Not on file   Highest education level: Not on file  Occupational History   Not on file  Tobacco Use   Smoking status: Never   Smokeless tobacco: Never  Vaping Use   Vaping Use: Never used  Substance and Sexual Activity   Alcohol use: Not Currently    Alcohol/week: 0.0 standard drinks    Comment: rarely    Drug use: No   Sexual activity: Yes    Birth control/protection: Post-menopausal  Other Topics Concern   Not on file  Social History Narrative   Married.   No children.   Works for [employer] in [employer].   Enjoys hiking, spending time outdoors,  reading, golfing.    Social Determinants of Health   Financial Resource Strain: Not on file  Food Insecurity: Not on file  Transportation Needs: Not on file  Physical Activity: Not on file  Stress: Not on file  Social Connections: Not on file  Intimate Partner Violence: Not on file    Current Outpatient Medications on File Prior to Visit  Medication Sig Dispense Refill   acetaminophen (TYLENOL) 500 MG tablet Take 1,000 mg by mouth every 6 (six) hours as needed (pain.).     cetirizine (ZYRTEC) 10 MG tablet Take 10 mg by mouth daily.       diphenhydrAMINE (BENADRYL) 25 mg capsule Take 25 mg by mouth daily as needed for allergies.      esomeprazole (NEXIUM) 20 MG capsule Take 20 mg by mouth daily at 12 noon.     estradiol (ESTRACE) 1 MG tablet TAKE 1 TABLET BY MOUTH  DAILY 90 tablet 0   fluconazole (DIFLUCAN) 150 MG tablet Take 1 tablet (150 mg total) by mouth as directed. Take one pill today then Saturday then 1 week later 3 tablet 0   medroxyPROGESTERone (PROVERA) 2.5 MG tablet Take 1 tablet (2.5 mg total) by mouth daily. 90 tablet 3   Multiple Vitamins-Minerals (HAIR SKIN NAILS PO) Take 2 tablets by mouth daily. Hair/Skin/Nails by V[employer]Fusion     mupirocin ointment (BACTROBAN) 2 % Apply 1 application topically daily.     oxyCODONE-acetaminophen (PERCOCET) 5-325 MG tablet Take 1-2 tablets by mouth every 6 (six) hours as needed for severe pain. 20 tablet 0   No current facility-administered medications on file prior to visit.    Allergies  Allergen Reactions   Penicillins Hives   Etodolac Other (See Comments)    Abdominal pain   Meloxicam Other (See Comments)    Abdominal pain   Prednisone Rash    Abdominal pain     Review of Systems Constitutional: negative for chills, fatigue, fevers and sweats Eyes: negative for irritation, redness and visual disturbance Ears, nose, mouth, throat, and face: negative for hearing loss, nasal congestion, snoring and tinnitus Respiratory: negative for asthma, cough, sputum Cardiovascular: negative for chest pain, dyspnea, exertional chest pressure/discomfort, irregular heart beat, palpitations and syncope Gastrointestinal: negative for abdominal pain, change in bowel habits, nausea and vomiting Genitourinary: negative for abnormal menstrual periods, genital lesions, sexual problems and vaginal discharge, dysuria and urinary incontinence Integument/breast: negative for breast lump, breast tenderness and nipple discharge Hematologic/lymphatic: negative for bleeding and easy  bruising Musculoskeletal:negative for back pain and muscle weakness Neurological: negative for dizziness, headaches, vertigo and weakness Endocrine: negative for diabetic symptoms including polydipsia, polyuria and skin dryness Allergic/Immunologic: negative for hay fever and urticaria      Objective:  Blood pressure 120/77, pulse 66, resp. rate 16, height '5\' 2"'$  (1.575 m), weight 159 lb (72.1 kg), last menstrual period 07/12/2016. Body mass index is 29.08 kg/m.   General Appearance:    Alert, cooperative, no distress, appears stated age, overweight  Head:    Normocephalic, without obvious abnormality, atraumatic  Eyes:    PERRL, conjunctiva/corneas clear, EOM's intact, both eyes  Ears:    Normal external ear canals, both ears  Nose:   Nares normal, septum midline, mucosa normal, no drainage or sinus tenderness  Throat:   Lips, mucosa, and tongue normal; teeth and gums normal  Neck:   Supple, symmetrical, trachea midline, no adenopathy; thyroid: no enlargement/tenderness/nodules; no carotid bruit or JVD  Back:     Symmetric, no curvature, ROM normal,  no CVA tenderness  Lungs:     Clear to auscultation bilaterally, respirations unlabored  Chest Wall:    No tenderness or deformity   Heart:    Regular rate and rhythm, S1 and S2 normal, no murmur, rub or gallop  Breast Exam:    No tenderness, masses, or nipple abnormality  Abdomen:     Soft, non-tender, bowel sounds active all four quadrants, no masses, no organomegaly.    Genitalia:    Pelvic:external genitalia normal, vagina without lesions, discharge, or tenderness, rectovaginal septum  normal. Cervix normal in appearance, no cervical motion tenderness, no adnexal masses or tenderness.  Uterus normal size, shape, mobile, regular contours, nontender.  Rectal:    Normal external sphincter.  No hemorrhoids appreciated. Internal exam not done.   Extremities:   Extremities normal, atraumatic, no cyanosis or edema  Pulses:   2+ and symmetric all  extremities  Skin:   Skin color, texture, turgor normal, no rashes or lesions  Lymph nodes:   Cervical, supraclavicular, and axillary nodes normal  Neurologic:   CNII-XII intact, normal strength, sensation and reflexes throughout   .  Labs:  Lab Results  Component Value Date   WBC 5.6 12/07/2019   HGB 14.2 12/07/2019   HCT 40.4 12/07/2019   MCV 86.5 12/07/2019   PLT 238 12/07/2019    Lab Results  Component Value Date   CREATININE 0.72 12/10/2019   BUN 8 12/09/2019   NA 138 12/09/2019   K 4.1 12/09/2019   CL 107 12/09/2019   CO2 25 12/09/2019    Lab Results  Component Value Date   ALT 23 12/07/2019   AST 27 12/07/2019   ALKPHOS 26 (L) 12/07/2019   BILITOT 0.6 12/07/2019    Lab Results  Component Value Date   TSH 1.790 07/17/2017     Assessment:   1. Well woman exam   2. Cervical cancer screening   3. Encounter for screening mammogram for malignant neoplasm of breast   4. Need for hepatitis C screening test   5. Screening for diabetes mellitus (DM)   6. Screening for cholesterol level   7. Overweight (BMI 25.0-29.9)   8. Vasomotor symptoms due to menopause      Plan:  Blood tests: Vitamin D and Hepatitis C. Patient had labs done by PCP in April. Breast self exam technique reviewed and patient encouraged to perform self-exam monthly. Contraception: post menopausal status. Discussed healthy lifestyle modifications. Mammogram: Up to date. Normal. Continue yearly screens.  Pap smear ordered. COVID vaccination status: Declined.  Discussed other health maintenance, overdue for Tetanus and Shingles vaccinations, notes she will request from her PCP. Also recommended Hepatitis C screening once in lifetime. Will order today.  For refill on Estradiol for vasomotor symptoms. Notes that beginning next year she will begin to try weaning as she will have been on medication for 5 years.  Follow up in 1 year for annual exam.   Rubie Maid, MD [facility]

## 2020-12-01 NOTE — Patient Instructions (Signed)
Breast Self-Awareness Breast self-awareness is knowing how your breasts look and feel. Doing breast self-awareness is important. It allows you to catch a breast problem early while it is still small and can be treated. All women should do breast self-awareness, including women who have had breast implants. Tell your doctorif you notice a change in your breasts. What you need: A mirror. A well-lit room. How to do a breast self-exam A breast self-exam is one way to learn what is normal for your breasts and tocheck for changes. To do a breast self-exam: Look for changes  Take off all the clothes above your waist. Stand in front of a mirror in a room with good lighting. Put your hands on your hips. Push your hands down. Look at your breasts and nipples in the mirror to see if one breast or nipple looks different from the other. Check to see if: The shape of one breast is different. The size of one breast is different. There are wrinkles, dips, and bumps in one breast and not the other. Look at each breast for changes in the skin, such as: Redness. Scaly areas. Look for changes in your nipples, such as: Liquid around the nipples. Bleeding. Dimpling. Redness. A change in where the nipples are.  Feel for changes  Lie on your back on the floor. Feel each breast. To do this, follow these steps: Pick a breast to feel. Put the arm closest to that breast above your head. Use your other arm to feel the nipple area of your breast. Feel the area with the pads of your three middle fingers by making small circles with your fingers. For the first circle, press lightly. For the second circle, press harder. For the third circle, press even harder. Keep making circles with your fingers at the different pressures as you move down your breast. Stop when you feel your ribs. Move your fingers a little toward the center of your body. Start making circles with your fingers again, this time going up until  you reach your collarbone. Keep making up-and-down circles until you reach your armpit. Remember to keep using the three pressures. Feel the other breast in the same way. Sit or stand in the tub or shower. With soapy water on your skin, feel each breast the same way you did in step 2 when you were lying on the floor.  Write down what you find Writing down what you find can help you remember what to tell your doctor. Write down: What is normal for each breast. Any changes you find in each breast, including: The kind of changes you find. Whether you have pain. Size and location of any lumps. When you last had your menstrual period. General tips Check your breasts every month. If you are breastfeeding, the best time to check your breasts is after you feed your baby or after you use a breast pump. If you get menstrual periods, the best time to check your breasts is 5-7 days after your menstrual period is over. With time, you will become comfortable with the self-exam, and you will begin to know if there are changes in your breasts. Contact a doctor if you: See a change in the shape or size of your breasts or nipples. See a change in the skin of your breast or nipples, such as red or scaly skin. Have fluid coming from your nipples that is not normal. Find a lump or thick area that was not there before. Have pain in   your breasts. Have any concerns about your breast health. Summary Breast self-awareness includes looking for changes in your breasts, as well as feeling for changes within your breasts. Breast self-awareness should be done in front of a mirror in a well-lit room. You should check your breasts every month. If you get menstrual periods, the best time to check your breasts is 5-7 days after your menstrual period is over. Let your doctor know of any changes you see in your breasts, including changes in size, changes on the skin, pain or tenderness, or fluid from your nipples that is  not normal. This information is not intended to replace advice given to you by your health care provider. Make sure you discuss any questions you have with your healthcare provider. Document Revised: 11/14/2017 Document Reviewed: 11/14/2017 Elsevier Patient Education  2022 Elsevier Inc.     Preventive Care 40-64 Years Old, Female Preventive care refers to lifestyle choices and visits with your health care provider that can promote health and wellness. This includes: A yearly physical exam. This is also called an annual wellness visit. Regular dental and eye exams. Immunizations. Screening for certain conditions. Healthy lifestyle choices, such as: Eating a healthy diet. Getting regular exercise. Not using drugs or products that contain nicotine and tobacco. Limiting alcohol use. What can I expect for my preventive care visit? Physical exam Your health care provider will check your: Height and weight. These may be used to calculate your BMI (body mass index). BMI is a measurement that tells if you are at a healthy weight. Heart rate and blood pressure. Body temperature. Skin for abnormal spots. Counseling Your health care provider may ask you questions about your: Past medical problems. Family's medical history. Alcohol, tobacco, and drug use. Emotional well-being. Home life and relationship well-being. Sexual activity. Diet, exercise, and sleep habits. Work and work environment. Access to firearms. Method of birth control. Menstrual cycle. Pregnancy history. What immunizations do I need?  Vaccines are usually given at various ages, according to a schedule. Your health care provider will recommend vaccines for you based on your age, medicalhistory, and lifestyle or other factors, such as travel or where you work. What tests do I need? Blood tests Lipid and cholesterol levels. These may be checked every 5 years, or more often if you are over 50 years old. Hepatitis C  test. Hepatitis B test. Screening Lung cancer screening. You may have this screening every year starting at age 55 if you have a 30-pack-year history of smoking and currently smoke or have quit within the past 15 years. Colorectal cancer screening. All adults should have this screening starting at age 50 and continuing until age 75. Your health care provider may recommend screening at age 45 if you are at increased risk. You will have tests every 1-10 years, depending on your results and the type of screening test. Diabetes screening. This is done by checking your blood sugar (glucose) after you have not eaten for a while (fasting). You may have this done every 1-3 years. Mammogram. This may be done every 1-2 years. Talk with your health care provider about when you should start having regular mammograms. This may depend on whether you have a family history of breast cancer. BRCA-related cancer screening. This may be done if you have a family history of breast, ovarian, tubal, or peritoneal cancers. Pelvic exam and Pap test. This may be done every 3 years starting at age 21. Starting at age 30, this may be done every   5 years if you have a Pap test in combination with an HPV test. Other tests STD (sexually transmitted disease) testing, if you are at risk. Bone density scan. This is done to screen for osteoporosis. You may have this scan if you are at high risk for osteoporosis. Talk with your health care provider about your test results, treatment options,and if necessary, the need for more tests. Follow these instructions at home: Eating and drinking  Eat a diet that includes fresh fruits and vegetables, whole grains, lean protein, and low-fat dairy products. Take vitamin and mineral supplements as recommended by your health care provider. Do not drink alcohol if: Your health care provider tells you not to drink. You are pregnant, may be pregnant, or are planning to become pregnant. If  you drink alcohol: Limit how much you have to 0-1 drink a day. Be aware of how much alcohol is in your drink. In the [place] one drink equals one 12 oz bottle of beer (355 mL), one 5 oz glass of wine (148 mL), or one 1 oz glass of hard liquor (44 mL).  Lifestyle Take daily care of your teeth and gums. Brush your teeth every morning and night with fluoride toothpaste. Floss one time each day. Stay active. Exercise for at least 30 minutes 5 or more days each week. Do not use any products that contain nicotine or tobacco, such as cigarettes, e-cigarettes, and chewing tobacco. If you need help quitting, ask your health care provider. Do not use drugs. If you are sexually active, practice safe sex. Use a condom or other form of protection to prevent STIs (sexually transmitted infections). If you do not wish to become pregnant, use a form of birth control. If you plan to become pregnant, see your health care provider for a prepregnancy visit. If told by your health care provider, take low-dose aspirin daily starting at age 50. Find healthy ways to cope with stress, such as: Meditation, yoga, or listening to music. Journaling. Talking to a trusted person. Spending time with friends and family. Safety Always wear your seat belt while driving or riding in a vehicle. Do not drive: If you have been drinking alcohol. Do not ride with someone who has been drinking. When you are tired or distracted. While texting. Wear a helmet and other protective equipment during sports activities. If you have firearms in your house, make sure you follow all gun safety procedures. What's next? Visit your health care provider once a year for an annual wellness visit. Ask your health care provider how often you should have your eyes and teeth checked. Stay up to date on all vaccines. This information is not intended to replace advice given to you by your health care provider. Make sure you discuss any questions you  have with your healthcare provider. Document Revised: 12/31/2019 Document Reviewed: 12/07/2017 Elsevier Patient Education  2022 Elsevier Inc.  

## 2020-12-02 ENCOUNTER — Encounter: Payer: Self-pay | Admitting: Obstetrics and Gynecology

## 2020-12-02 ENCOUNTER — Ambulatory Visit (INDEPENDENT_AMBULATORY_CARE_PROVIDER_SITE_OTHER): Payer: Managed Care, Other (non HMO) | Admitting: Obstetrics and Gynecology

## 2020-12-02 ENCOUNTER — Other Ambulatory Visit (HOSPITAL_COMMUNITY)
Admission: RE | Admit: 2020-12-02 | Discharge: 2020-12-02 | Disposition: A | Discharge status: Home / Self Care | Payer: Managed Care, Other (non HMO) | Source: Ambulatory Visit | Attending: Obstetrics and Gynecology | Admitting: Obstetrics and Gynecology

## 2020-12-02 ENCOUNTER — Other Ambulatory Visit: Payer: Self-pay

## 2020-12-02 VITALS — BP 120/77 | HR 66 | Resp 16 | Ht 62.0 in | Wt 159.0 lb

## 2020-12-02 DIAGNOSIS — Z1231 Encounter for screening mammogram for malignant neoplasm of breast: Secondary | ICD-10-CM

## 2020-12-02 DIAGNOSIS — Z01419 Encounter for gynecological examination (general) (routine) without abnormal findings: Secondary | ICD-10-CM

## 2020-12-02 DIAGNOSIS — Z124 Encounter for screening for malignant neoplasm of cervix: Secondary | ICD-10-CM | POA: Diagnosis not present

## 2020-12-02 DIAGNOSIS — Z13220 Encounter for screening for lipoid disorders: Secondary | ICD-10-CM

## 2020-12-02 DIAGNOSIS — Z1159 Encounter for screening for other viral diseases: Secondary | ICD-10-CM

## 2020-12-02 DIAGNOSIS — N951 Menopausal and female climacteric states: Secondary | ICD-10-CM

## 2020-12-02 DIAGNOSIS — E663 Overweight: Secondary | ICD-10-CM

## 2020-12-02 DIAGNOSIS — Z131 Encounter for screening for diabetes mellitus: Secondary | ICD-10-CM

## 2020-12-02 MED ORDER — ESTRADIOL 1 MG PO TABS
1.0000 mg | ORAL_TABLET | Freq: Every day | ORAL | 1 refills | Status: DC
Start: 2020-12-02 — End: 2021-09-08

## 2020-12-03 LAB — HEPATITIS C ANTIBODY: Hep C Virus Ab: <0.1 s/co ratio (ref 0.0–0.9)

## 2020-12-03 LAB — VITAMIN D 25 HYDROXY (VIT D DEFICIENCY, FRACTURES): Vit D, 25-Hydroxy: 54.5 ng/mL (ref 30.0–100.0)

## 2020-12-04 LAB — CYTOLOGY - PAP
Comment: NEGATIVE
Diagnosis: NEGATIVE
High risk HPV: NEGATIVE

## 2020-12-17 NOTE — Telephone Encounter (Signed)
Please advise. Thanks Genesia Caslin 

## 2020-12-22 ENCOUNTER — Other Ambulatory Visit: Payer: Self-pay | Admitting: Obstetrics and Gynecology

## 2021-01-08 NOTE — Progress Notes (Signed)
    GYNECOLOGY PROGRESS NOTE  Subjective:    Patient ID: Christine Schroeder, female    DOB: 10-04-1965, 55 y.o.   MRN: [id number]  HPI  Patient is a 55 y.o. G0P0000 female who presents for postmenopausal bleeding. She has been postmenopausal for ~ 5 years. She reports that after coming in for her pap smear last month, she noted bleeding that started several days after this.  Bleeding was light, but did last 7 days.  Notes that she stopped taking her Estradiol after calling the office for advice on management.  The bleeding did stop, but notes this month she felt crampy and had light bleeding for 1 day. Of note, patient is on HRT, has been on medication for approximately 5 -7 years.  This is her first episode of bleeding.   The following portions of the patient's history were reviewed and updated as appropriate: allergies, current medications, past family history, past medical history, past social history, past surgical history, and problem list.  Review of Systems Pertinent items noted in HPI and remainder of comprehensive ROS otherwise negative.   Body mass index is 29.21 kg/m. Blood pressure 131/82, pulse 64, resp. rate 16, height 5\' 2"  (1.575 m), weight 159 lb 11.2 oz (72.4 kg), last menstrual period 07/12/2016.  General appearance: alert and no distress Remainder of exam deferred. Please see prior exam by me performed 12/12/2020.    Labs:  Office Visit on 12/02/2020  Component Date Value Ref Range Status   High risk HPV 12/02/2020 Negative   Final   Adequacy 12/02/2020 Satisfactory for evaluation; transformation zone component PRESENT.   Final   Diagnosis 12/02/2020 - Negative for intraepithelial lesion or malignancy (NILM)   Final   Comment 12/02/2020 Normal Reference Range HPV - Negative   Final    Assessment:   1. PMB (postmenopausal bleeding)   2. Post-menopause on HRT (hormone replacement therapy)      Plan:   Review of medication notes patient is currently on unopposed  estrogen, still retains her uterus. Has only been on new prescription x 2 months (recently filled, unclear if this was a prescribing error).  Will order pelvic ultrasound to rule out hyperplasia or malignancy. If abnormalities noted, will follow up with endometrial biopsy. If ultrasound is normal, patient can resume prescription, however advised that she will need to do 21 days on/7 days off regimen, or can supplement with oral progesterone. Patient desires to to 21/7 regimen.     Rubie Maid, MD [facility]

## 2021-01-12 ENCOUNTER — Encounter: Payer: Self-pay | Admitting: Obstetrics and Gynecology

## 2021-01-12 ENCOUNTER — Other Ambulatory Visit: Payer: Self-pay | Admitting: Obstetrics and Gynecology

## 2021-01-12 ENCOUNTER — Ambulatory Visit (INDEPENDENT_AMBULATORY_CARE_PROVIDER_SITE_OTHER): Payer: Managed Care, Other (non HMO) | Admitting: Obstetrics and Gynecology

## 2021-01-12 ENCOUNTER — Ambulatory Visit (INDEPENDENT_AMBULATORY_CARE_PROVIDER_SITE_OTHER): Payer: Managed Care, Other (non HMO)

## 2021-01-12 ENCOUNTER — Other Ambulatory Visit: Payer: Self-pay

## 2021-01-12 VITALS — BP 131/82 | HR 64 | Resp 16 | Ht 62.0 in | Wt 159.7 lb

## 2021-01-12 DIAGNOSIS — N950 Postmenopausal bleeding: Secondary | ICD-10-CM | POA: Diagnosis not present

## 2021-01-12 DIAGNOSIS — Z79890 Hormone replacement therapy (postmenopausal): Secondary | ICD-10-CM

## 2021-05-03 DIAGNOSIS — R42 Dizziness and giddiness: Secondary | ICD-10-CM | POA: Insufficient documentation

## 2021-09-07 ENCOUNTER — Telehealth: Payer: Self-pay | Admitting: Obstetrics and Gynecology

## 2021-09-07 NOTE — Telephone Encounter (Signed)
Pt called to schedule her Physical in September (9/5) she states that she is not able to get her estradiol refilled, she only has 10 pills left. She is requesting refills to get her to her apt in September. Please advise.   Confirmed pharmacy as [facility] on [address]

## 2021-09-08 MED ORDER — ESTRADIOL 1 MG PO TABS: 1.0000 mg | ORAL_TABLET | Freq: Every day | ORAL | 0 refills | Status: DC

## 2021-09-08 NOTE — Telephone Encounter (Signed)
Sent rx to [facility] on [address].

## 2021-12-08 ENCOUNTER — Other Ambulatory Visit: Payer: Self-pay | Admitting: Obstetrics and Gynecology

## 2021-12-10 NOTE — Progress Notes (Unsigned)
ANNUAL PREVENTATIVE CARE GYNECOLOGY  ENCOUNTER NOTE  Subjective:       Christine Schroeder is a 56 y.o. G0P0000 female here for a routine annual gynecologic exam. The patient is sexually active. She reports using hormone replacement therapy, has been on therapy for ~  7 years. Patient denies post-menopausal vaginal bleeding. The patient wears seatbelts: yes. The patient participates in regular exercise: yes (walking daily). Has the patient ever been transfused or tattooed?: no. The patient reports that there is not domestic violence in her life.   Current complaints: 1.  Desires to know what she should do now that she has been on HRT for the past 5 years.     Gynecologic History Patient's last menstrual period was 04/11/2016 (approximate). Contraception: post menopausal status  Last Pap: 12/02/2020. Results were: normal Last mammogram: 10/20/2021. Results were: normal Last Colonoscopy: 11/03/2017. Every 5 years   Obstetric History OB History  Gravida Para Term Preterm AB Living  0 0 0 0 0 0  SAB IAB Ectopic Multiple Live Births  0 0 0 0      Past Medical History:  Diagnosis Date   Allergic rhinitis    Chickenpox    Family history of adverse reaction to anesthesia    Mother - PONV   GERD (gastroesophageal reflux disease)    History of MRSA infection 09/2019   MRSA infection of foot after cortisol injection.   Increased BMI     Family History  Problem Relation Age of Onset   Hypertension Mother    Heart disease Maternal Uncle    Stroke Maternal Grandmother    Diabetes Maternal Grandmother    Diabetes Maternal Grandfather    Colon cancer Maternal Aunt    Cancer Neg Hx     Past Surgical History:  Procedure Laterality Date   BUNIONECTOMY Left 01/17/2020   Procedure: BUNIONECTOMY TAILOR'S;  Surgeon: Samara Deist, DPM;  Location: [facility] ORS;  Service: Podiatry;  Laterality: Left;   COLONOSCOPY WITH PROPOFOL N/A 11/03/2017   Procedure: COLONOSCOPY WITH PROPOFOL;   Surgeon: Jonathon Bellows, MD;  Location: [facility] END[facility];  Service: Endoscopy;  Laterality: N/A;   IRRIGATION AND DEBRIDEMENT FOOT Left 12/08/2019   Procedure: IRRIGATION AND DEBRIDEMENT FOOT;  Surgeon: Samara Deist, DPM;  Location: [facility] ORS;  Service: Podiatry;  Laterality: Left;   SHOULDER ARTHROSCOPY Right 02/10/2016   Procedure: Limited arthroscopic debridement, arthroscopic SLAP tear, subacromial decompression, mini-open rotator cuff repair, and mini-open biceps tenodesis, right shoulder;  Surgeon: Corky Mull, MD;  Location: [facility];  Service: Orthopedics;  Laterality: Right;    Social History   Socioeconomic History   Marital status: Married    Spouse name: Not on file   Number of children: Not on file   Years of education: Not on file   Highest education level: Not on file  Occupational History   Not on file  Tobacco Use   Smoking status: Never   Smokeless tobacco: Never  Vaping Use   Vaping Use: Never used  Substance and Sexual Activity   Alcohol use: Not Currently    Alcohol/week: 0.0 standard drinks of alcohol    Comment: rarely    Drug use: No   Sexual activity: Yes    Birth control/protection: Post-menopausal  Other Topics Concern   Not on file  Social History Narrative   Married.   No children.   Works for [employer] in [employer].   Enjoys hiking, spending time outdoors, reading, golfing.    Social  Determinants of Health   Financial Resource Strain: Not on file  Food Insecurity: Not on file  Transportation Needs: Not on file  Physical Activity: Sufficiently Active (07/12/2017)   Exercise Vital Sign    Days of Exercise per Week: 6 days    Minutes of Exercise per Session: 60 min  Stress: Not on file  Social Connections: Not on file  Intimate Partner Violence: Not on file    Current Outpatient Medications on File Prior to Visit  Medication Sig Dispense Refill   acetaminophen (TYLENOL) 500 MG tablet Take 1,000 mg by mouth every 6  (six) hours as needed (pain.).     cetirizine (ZYRTEC) 10 MG tablet Take 10 mg by mouth daily.      diphenhydrAMINE (BENADRYL) 25 mg capsule Take 25 mg by mouth daily as needed for allergies.      esomeprazole (NEXIUM) 20 MG capsule Take 20 mg by mouth daily at 12 noon.     estradiol (ESTRACE) 1 MG tablet TAKE 1 TABLET(1 MG) BY MOUTH DAILY 90 tablet 3   Multiple Vitamins-Minerals (HAIR SKIN NAILS PO) Take 2 tablets by mouth daily. Hair/Skin/Nails by [employer]     mupirocin ointment (BACTROBAN) 2 % Apply 1 application topically daily.     No current facility-administered medications on file prior to visit.    Allergies  Allergen Reactions   Penicillins Hives   Etodolac Other (See Comments)    Abdominal pain   Meloxicam Other (See Comments)    Abdominal pain   Prednisone Rash    Abdominal pain      Review of Systems ROS Review of Systems - General ROS: negative for - chills, fatigue, fever, hot flashes, night sweats, weight gain or weight loss Psychological ROS: negative for - anxiety, decreased libido, depression, mood swings, physical abuse or sexual abuse Ophthalmic ROS: negative for - blurry vision, eye pain or loss of vision ENT ROS: negative for - headaches, hearing change, visual changes or vocal changes Allergy and Immunology ROS: negative for - hives, itchy/watery eyes or seasonal allergies Hematological and Lymphatic ROS: negative for - bleeding problems, bruising, swollen lymph nodes or weight loss Endocrine ROS: negative for - galactorrhea, hair pattern changes, hot flashes, malaise/lethargy, mood swings, palpitations, polydipsia/polyuria, skin changes, temperature intolerance or unexpected weight changes Breast ROS: negative for - new or changing breast lumps or nipple discharge Respiratory ROS: negative for - cough or shortness of breath Cardiovascular ROS: negative for - chest pain, irregular heartbeat, palpitations or shortness of breath Gastrointestinal ROS: no  abdominal pain, change in bowel habits, or black or bloody stools Genito-Urinary ROS: no dysuria, trouble voiding, or hematuria Musculoskeletal ROS: negative for - joint pain or joint stiffness Neurological ROS: negative for - bowel and bladder control changes Dermatological ROS: negative for rash and skin lesion changes   Objective:   Resp. rate 16, height '5\' 2"'$  (1.575 m), weight 156 lb 14.4 oz (71.2 kg), last menstrual period 04/11/2016.  Body mass index is 28.7 kg/m.  CONSTITUTIONAL: Well-developed, well-nourished female in no acute distress.  PSYCHIATRIC: Normal mood and affect. Normal behavior. Normal judgment and thought content. [place]: Alert and oriented to person, place, and time. Normal muscle tone coordination. No cranial nerve deficit noted. HENT:  Normocephalic, atraumatic, External right and left ear normal. Oropharynx is clear and moist EYES: Conjunctivae and EOM are normal. Pupils are equal, round, and reactive to light. No scleral icterus.  NECK: Normal range of motion, supple, no masses.  Normal thyroid.  SKIN: Skin is  warm and dry. No rash noted. Not diaphoretic. No erythema. No pallor. CARDIOVASCULAR: Normal heart rate noted, regular rhythm, no murmur. RESPIRATORY: Clear to auscultation bilaterally. Effort and breath sounds normal, no problems with respiration noted. BREASTS: Symmetric in size. No masses, skin changes, nipple drainage, or lymphadenopathy. ABDOMEN: Soft, normal bowel sounds, no distention noted.  No tenderness, rebound or guarding.  BLADDER: Normal PELVIC:  Bladder no bladder distension noted  Urethra: normal appearing urethra with no masses, tenderness or lesions  Vulva: normal appearing vulva with no masses, tenderness or lesions  Vagina: normal appearing vagina with normal color and discharge, no lesions  Cervix: normal appearing cervix without discharge or lesions  Uterus: uterus is normal size, shape, consistency and nontender  Adnexa: normal  adnexa in size, nontender and no masses  RV: External Exam NormaI, No Rectal Masses, and Normal Sphincter tone  MUSCULOSKELETAL: Normal range of motion. No tenderness.  No cyanosis, clubbing, or edema.  2+ distal pulses. LYMPHATIC: No Axillary, Supraclavicular, or Inguinal Adenopathy.   Labs: Has labs with PCP   Assessment:   1. Encounter for well woman exam with routine gynecological exam   2. Overweight (BMI 25.0-29.9)   3. Post-menopause on HRT (hormone replacement therapy)   4. Borderline diabetes      Plan:  - Pap:  up to date - Mammogram:  up to date . Continue routine screening.  - Colon Screening:   up to date. Due in 1 year. - Labs:  Done by PCP - Routine preventative health maintenance measures emphasized.  - Prediabetes, managed by PCP.  - Discussed risks and benefits of continuation of HRT vs weaning. Discussed alternatives to HRT if she remains symptomatic. Patient notes she will attempt to wean off HRT. Currently on 21/7 regimen.  - Return to [place], MD [facility]

## 2021-12-14 ENCOUNTER — Ambulatory Visit (INDEPENDENT_AMBULATORY_CARE_PROVIDER_SITE_OTHER): Payer: Managed Care, Other (non HMO) | Admitting: Obstetrics and Gynecology

## 2021-12-14 ENCOUNTER — Encounter: Payer: Self-pay | Admitting: Obstetrics and Gynecology

## 2021-12-14 VITALS — BP 120/74 | HR 74 | Resp 16 | Ht 62.0 in | Wt 156.9 lb

## 2021-12-14 DIAGNOSIS — E663 Overweight: Secondary | ICD-10-CM

## 2021-12-14 DIAGNOSIS — Z79890 Hormone replacement therapy (postmenopausal): Secondary | ICD-10-CM | POA: Diagnosis not present

## 2021-12-14 DIAGNOSIS — Z01419 Encounter for gynecological examination (general) (routine) without abnormal findings: Secondary | ICD-10-CM | POA: Diagnosis not present

## 2021-12-14 DIAGNOSIS — R7303 Prediabetes: Secondary | ICD-10-CM | POA: Diagnosis not present

## 2021-12-14 DIAGNOSIS — Z13220 Encounter for screening for lipoid disorders: Secondary | ICD-10-CM

## 2021-12-14 NOTE — Patient Instructions (Addendum)
Preventive Care 40-55 Years Old, Female Preventive care refers to lifestyle choices and visits with your health care provider that can promote health and wellness. Preventive care visits are also called wellness exams. What can I expect for my preventive care visit? Counseling Your health care provider may ask you questions about your: Medical history, including: Past medical problems. Family medical history. Pregnancy history. Current health, including: Menstrual cycle. Method of birth control. Emotional well-being. Home life and relationship well-being. Sexual activity and sexual health. Lifestyle, including: Alcohol, nicotine or tobacco, and drug use. Access to firearms. Diet, exercise, and sleep habits. Work and work environment. Sunscreen use. Safety issues such as seatbelt and bike helmet use. Physical exam Your health care provider will check your: Height and weight. These may be used to calculate your BMI (body mass index). BMI is a measurement that tells if you are at a healthy weight. Waist circumference. This measures the distance around your waistline. This measurement also tells if you are at a healthy weight and may help predict your risk of certain diseases, such as type 2 diabetes and high blood pressure. Heart rate and blood pressure. Body temperature. Skin for abnormal spots. What immunizations do I need?  Vaccines are usually given at various ages, according to a schedule. Your health care provider will recommend vaccines for you based on your age, medical history, and lifestyle or other factors, such as travel or where you work. What tests do I need? Screening Your health care provider may recommend screening tests for certain conditions. This may include: Lipid and cholesterol levels. Diabetes screening. This is done by checking your blood sugar (glucose) after you have not eaten for a while (fasting). Pelvic exam and Pap test. Hepatitis B test. Hepatitis C  test. HIV (human immunodeficiency virus) test. STI (sexually transmitted infection) testing, if you are at risk. Lung cancer screening. Colorectal cancer screening. Mammogram. Talk with your health care provider about when you should start having regular mammograms. This may depend on whether you have a family history of breast cancer. BRCA-related cancer screening. This may be done if you have a family history of breast, ovarian, tubal, or peritoneal cancers. Bone density scan. This is done to screen for osteoporosis. Talk with your health care provider about your test results, treatment options, and if necessary, the need for more tests. Follow these instructions at home: Eating and drinking  Eat a diet that includes fresh fruits and vegetables, whole grains, lean protein, and low-fat dairy products. Take vitamin and mineral supplements as recommended by your health care provider. Do not drink alcohol if: Your health care provider tells you not to drink. You are pregnant, may be pregnant, or are planning to become pregnant. If you drink alcohol: Limit how much you have to 0-1 drink a day. Know how much alcohol is in your drink. In the [place] one drink equals one 12 oz bottle of beer (355 mL), one 5 oz glass of wine (148 mL), or one 1 oz glass of hard liquor (44 mL). Lifestyle Brush your teeth every morning and night with fluoride toothpaste. Floss one time each day. Exercise for at least 30 minutes 5 or more days each week. Do not use any products that contain nicotine or tobacco. These products include cigarettes, chewing tobacco, and vaping devices, such as e-cigarettes. If you need help quitting, ask your health care provider. Do not use drugs. If you are sexually active, practice safe sex. Use a condom or other form of protection to   prevent STIs. If you do not wish to become pregnant, use a form of birth control. If you plan to become pregnant, see your health care provider for a  prepregnancy visit. Take aspirin only as told by your health care provider. Make sure that you understand how much to take and what form to take. Work with your health care provider to find out whether it is safe and beneficial for you to take aspirin daily. Find healthy ways to manage stress, such as: Meditation, yoga, or listening to music. Journaling. Talking to a trusted person. Spending time with friends and family. Minimize exposure to UV radiation to reduce your risk of skin cancer. Safety Always wear your seat belt while driving or riding in a vehicle. Do not drive: If you have been drinking alcohol. Do not ride with someone who has been drinking. When you are tired or distracted. While texting. If you have been using any mind-altering substances or drugs. Wear a helmet and other protective equipment during sports activities. If you have firearms in your house, make sure you follow all gun safety procedures. Seek help if you have been physically or sexually abused. What's next? Visit your health care provider once a year for an annual wellness visit. Ask your health care provider how often you should have your eyes and teeth checked. Stay up to date on all vaccines. This information is not intended to replace advice given to you by your health care provider. Make sure you discuss any questions you have with your health care provider. Document Revised: 09/23/2020 Document Reviewed: 09/23/2020 Elsevier Patient Education  [place].

## 2022-03-03 IMAGING — MR MR FOOT*L* WO/W CM
9 series · 40 of 40 positions shown · IV contrast (gadavist)
Comparison: Left foot x-rays from same day.

CLINICAL DATA: Left foot wound near the fifth MTP joint.

EXAM:
MRI OF THE LEFT FOREFOOT WITHOUT AND WITH CONTRAST
TECHNIQUE: Multiplanar, multisequence MR imaging of the left forefoot was
performed both before and after administration of intravenous
contrast.
CONTRAST:  7.5mL GADAVIST GADOBUTROL 1 MMOL/ML IV SOLN

[Series 8: T2 · coronal · left · 3.0mm · 0.38mm/px · 7 of 44 slices shown (1 of 2)]
[im 1/44]
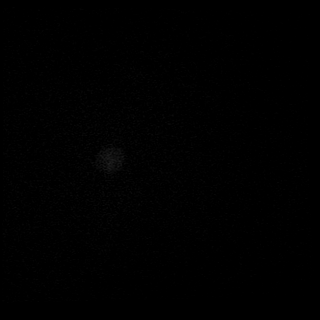
[im 8/44]
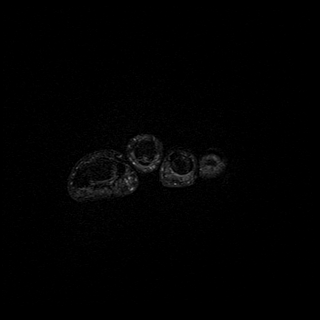
[im 15/44]
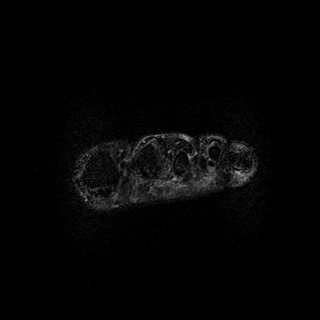
[im 22/44]
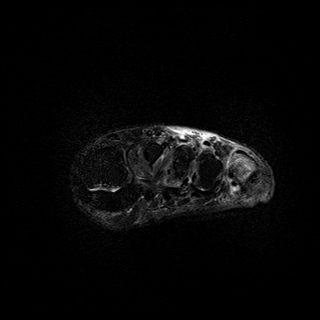
[im 29/44]
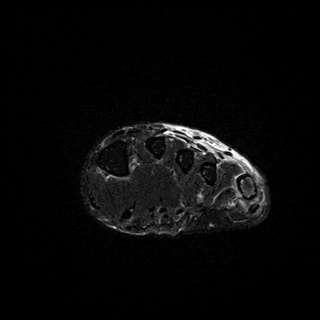
[im 36/44]
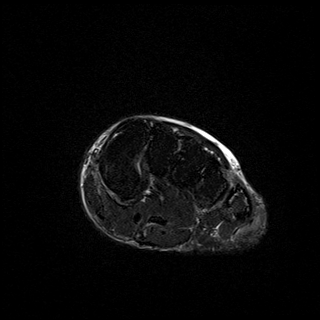
[im 44/44]
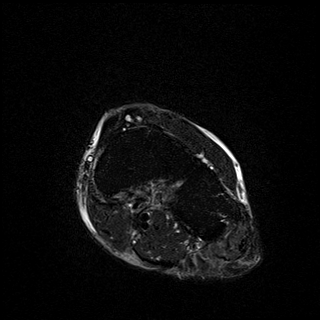

[Series 9: T1 · coronal · left · 3.0mm · 0.38mm/px · 7 of 44 slices shown (1 of 2)]
[im 1/44]
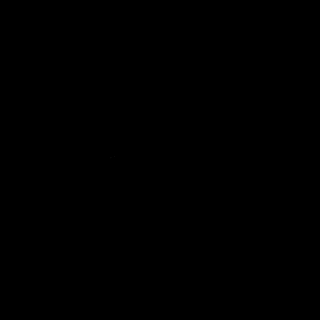
[im 8/44]
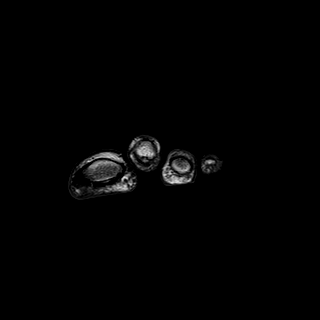
[im 15/44]
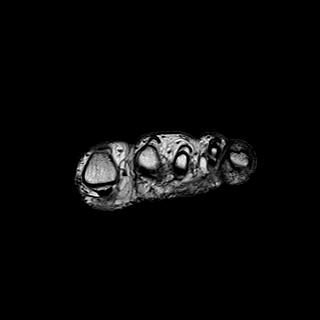
[im 22/44]
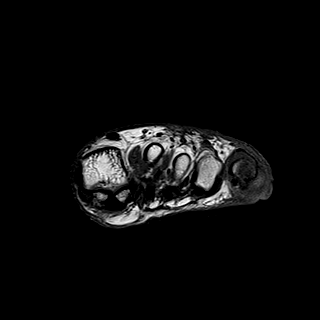
[im 29/44]
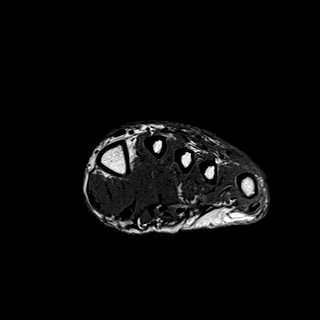
[im 36/44]
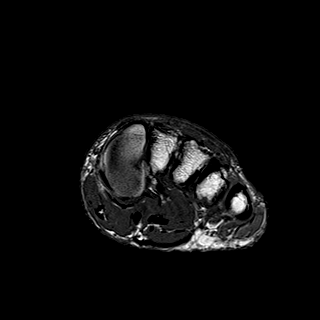
[im 44/44]
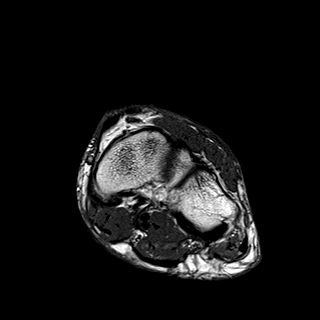

[Series 10: T1 · axial · left · 3.0mm · 0.70mm/px · z∈[-122,-66]mm · 2 of 17 slices shown (2 of 2)]
[im 1/17]
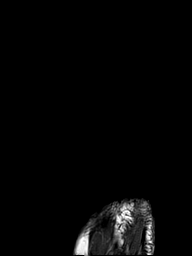
[im 17/17]
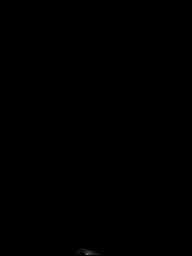

[Series 12: T2 · axial · left · 3.0mm · 0.70mm/px · z∈[-122,-66]mm · 2 of 17 slices shown (2 of 2)]
[im 1/17]
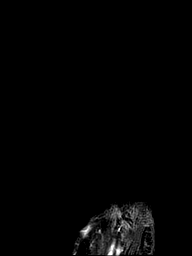
[im 17/17]
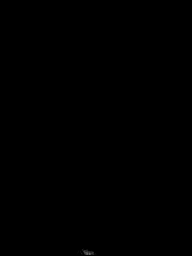

[Series 13: STIR · sagittal · left · 3.0mm · 0.62mm/px · 4 of 26 slices shown]
[im 1/26]
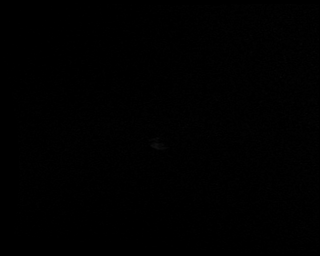
[im 9/26]
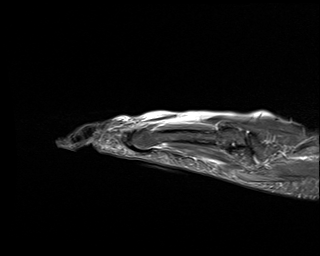
[im 17/26]
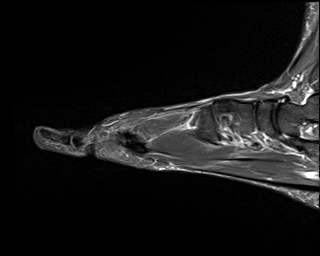
[im 26/26]
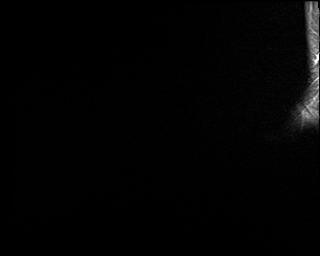

[Series 14: T1 fat-sat · coronal · non-contrast · left · 3.0mm · 0.47mm/px · 6 of 44 slices shown (1 of 3)]
[im 1/44]
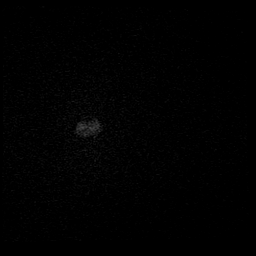
[im 9/44]
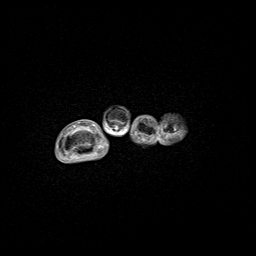
[im 18/44]
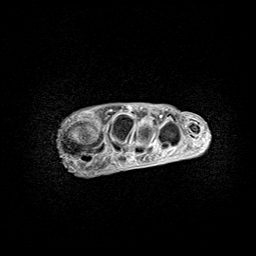
[im 26/44]
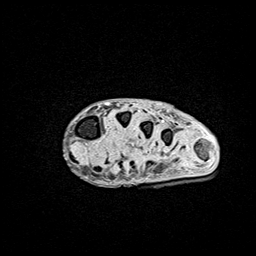
[im 35/44]
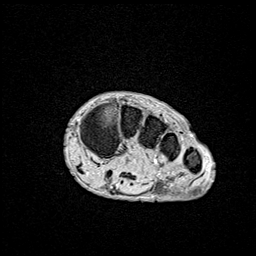
[im 44/44]
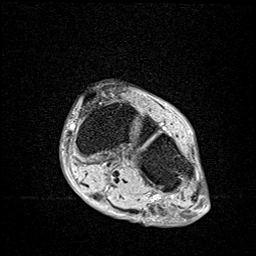

[Series 15: T1 fat-sat post-contrast · coronal · left · 3.0mm · 0.47mm/px · 6 of 44 slices shown]
[im 1/44]
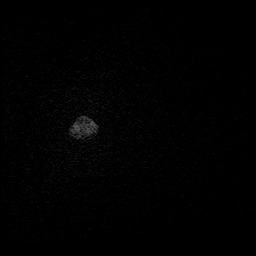
[im 9/44]
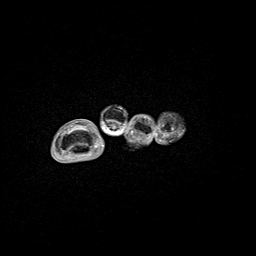
[im 18/44]
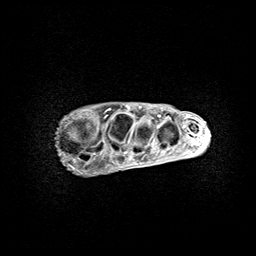
[im 26/44]
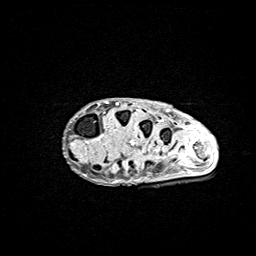
[im 35/44]
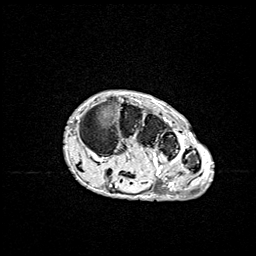
[im 44/44]
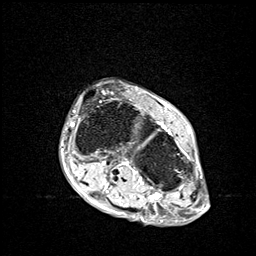

[Series 16: T1 fat-sat · sagittal · left · 3.0mm · 0.62mm/px · 4 of 26 slices shown (2 of 3)]
[im 1/26]
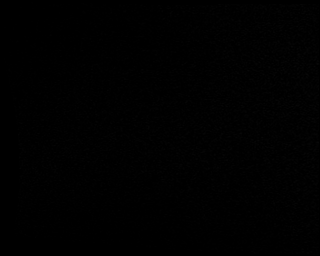
[im 9/26]
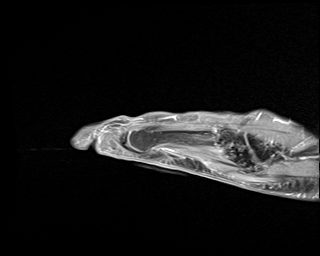
[im 17/26]
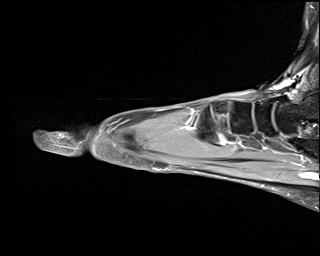
[im 26/26]
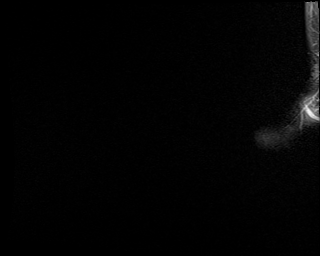

[Series 17: T1 fat-sat · axial · left · 3.0mm · 0.56mm/px · z∈[-122,-66]mm · 2 of 17 slices shown (3 of 3)]
[im 1/17]
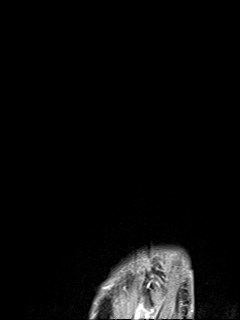
[im 17/17]
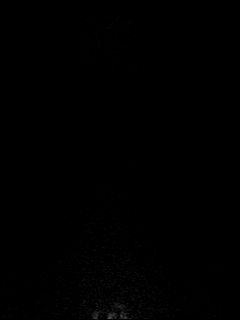

[40 of 40 positions shown; findings below may reference images not displayed]

FINDINGS: Bones/Joint/Cartilage

Abnormal marrow edema and enhancement with corresponding decreased
T1 marrow signal involving the fifth metatarsal head. Mild marrow
edema and enhancement involving fifth proximal phalanx. Dorsal and
medial subluxation of the fifth proximal phalanx with respect to the
fifth metatarsal. No fracture. Large fifth MTP joint effusion.

Ligaments

Attenuation of the fifth MTP joint collateral ligaments, likely
torn. Remaining collateral ligaments are intact. Lisfranc ligament
is intact.

Muscles and Tendons
Flexor and extensor tendons are intact. Small amount of fluid
surrounding the fifth flexor tendon at level of the MTP joint.

Soft tissue
Dorsal and lateral forefoot soft tissue swelling. No fluid
collection or hematoma. No soft tissue mass.
IMPRESSION: 1. Fifth MTP joint septic arthritis with osteomyelitis of fifth
metatarsal head and suspected early osteomyelitis of the fifth
proximal phalanx. No abscess.
2. Fifth MTP joint subluxation.
3. Mild fifth flexor tenosynovitis at level of the MTP joint.

## 2022-08-08 ENCOUNTER — Other Ambulatory Visit: Payer: Self-pay

## 2022-08-11 ENCOUNTER — Ambulatory Visit (INDEPENDENT_AMBULATORY_CARE_PROVIDER_SITE_OTHER): Payer: Managed Care, Other (non HMO) | Admitting: Physician Assistant

## 2022-08-11 ENCOUNTER — Encounter: Payer: Self-pay | Admitting: Physician Assistant

## 2022-08-11 VITALS — BP 117/77 | HR 73 | Temp 98.2°F | Ht 62.0 in | Wt 164.2 lb

## 2022-08-11 DIAGNOSIS — Z86010 Personal history of colonic polyps: Secondary | ICD-10-CM | POA: Diagnosis not present

## 2022-08-11 DIAGNOSIS — K219 Gastro-esophageal reflux disease without esophagitis: Secondary | ICD-10-CM | POA: Diagnosis not present

## 2022-08-11 NOTE — Progress Notes (Signed)
Celso Amy, PA-C [address]  [address]  [place], [place] [address]  Main: [phone]  Fax: [phone]   Gastroenterology Consultation  Referring Provider:     Marguarite Arbour, MD Primary Care Physician:  Marguarite Arbour, MD Primary Gastroenterologist: Dr. Wyline Mood  Reason for Consultation:     GERD & Hx of Colon Polyp        HPI:   Christine Schroeder is a 57 y.o. y/o female referred for consultation & management  by Marguarite Arbour, MD.    Symptoms: Patient states she has history of acid reflux for many years.  She has taken OTC Nexium 20 Mg once daily with good control of acid reflux.  She recently stopped taking OTC Nexium 2 weeks ago due to concerns about long-term adverse side effects of PPI use.  She has only had 1 or 2 episodes of heartburn in the past 2 weeks which was relieved after taking baking soda and Tums.  Currently she is doing very well with no heartburn.  She denies any alarm symptoms such as dysphagia, abdominal pain, weight loss, or anemia.  She has no family history of esophageal or stomach cancer.  She has never had an EGD.  She tries to avoid GERD trigger foods.  Labs 08/01/2022 showed normal CBC and CMP results.  First Screening colonoscopy done 10/2017 by Dr. Tobi Bastos showed 1 small 5 mm polyp removed from the descending colon.  Good prep.  5-year repeat Colonoscopy will be due 10/2022.  Her Maternal Aunt had colon cancer.  She denies any lower GI symptoms.    Past Medical History:  Diagnosis Date   Allergic rhinitis    Chickenpox    Family history of adverse reaction to anesthesia    Mother - PONV   GERD (gastroesophageal reflux disease)    History of MRSA infection 09/2019   MRSA infection of foot after cortisol injection.   Increased BMI     Past Surgical History:  Procedure Laterality Date   BUNIONECTOMY Left 01/17/2020   Procedure: BUNIONECTOMY TAILOR'S;  Surgeon: Gwyneth Revels, DPM;  Location: [facility] ORS;  Service: Podiatry;   Laterality: Left;   COLONOSCOPY WITH PROPOFOL N/A 11/03/2017   Procedure: COLONOSCOPY WITH PROPOFOL;  Surgeon: Wyline Mood, MD;  Location: [facility] ENDOSCOPY;  Service: Endoscopy;  Laterality: N/A;   IRRIGATION AND DEBRIDEMENT FOOT Left 12/08/2019   Procedure: IRRIGATION AND DEBRIDEMENT FOOT;  Surgeon: Gwyneth Revels, DPM;  Location: [facility] ORS;  Service: Podiatry;  Laterality: Left;   SHOULDER ARTHROSCOPY Right 02/10/2016   Procedure: Limited arthroscopic debridement, arthroscopic SLAP tear, subacromial decompression, mini-open rotator cuff repair, and mini-open biceps tenodesis, right shoulder;  Surgeon: Christena Flake, MD;  Location: [facility];  Service: Orthopedics;  Laterality: Right;    Prior to Admission medications   Medication Sig Start Date End Date Taking? Authorizing Provider  cetirizine (ZYRTEC) 10 MG tablet Take 10 mg by mouth. 03/13/18   [provider]  cholecalciferol (VITAMIN D3) 25 MCG (1000 UNIT) tablet Take 1,000 Units by mouth. 03/13/18   [provider]  estradiol (ESTRACE) 1 MG tablet TAKE 1 TABLET(1 MG) BY MOUTH DAILY 12/09/21   Hildred Laser, MD  fluorometholone (FML) 0.1 % ophthalmic suspension Place 1 drop into both eyes. 05/17/22   [provider]  Multiple Vitamins-Minerals (HAIR SKIN NAILS PO) Take 2 tablets by mouth daily. Hair/Skin/Nails by [employer]    [provider]  neomycin-polymyxin b-dexamethasone (MAXITROL) 3.5-10000-0.1 SUSP Place 1 drop into both  eyes. 07/29/22   [provider]  TYRVAYA 0.03 MG/ACT SOLN Place 1 spray into both nostrils 2 (two) times daily. 07/20/22   [provider]    Family History  Problem Relation Age of Onset   Hypertension Mother    Heart disease Maternal Uncle    Stroke Maternal Grandmother    Diabetes Maternal Grandmother    Diabetes Maternal Grandfather    Colon cancer Maternal Aunt    Cancer Neg Hx      Social History   Tobacco Use   Smoking status: Never    Smokeless tobacco: Never  Vaping Use   Vaping Use: Never used  Substance Use Topics   Alcohol use: Not Currently    Alcohol/week: 0.0 standard drinks of alcohol    Comment: rarely    Drug use: No    Allergies as of 08/11/2022 - Review Complete 08/11/2022  Allergen Reaction Noted   Penicillins Hives 02/04/2015   Etodolac Other (See Comments) 02/04/2015   Meloxicam Other (See Comments) 02/04/2015   Prednisone Rash 02/04/2015    Review of Systems:    All systems reviewed and negative except where noted in HPI.   Physical Exam:  BP 117/77 (BP Location: Left Arm, Patient Position: Sitting, Cuff Size: Normal)   Pulse 73   Temp 98.2 F (36.8 C) (Oral)   Ht 5\' 2"  (1.575 m)   Wt 164 lb 3.2 oz (74.5 kg)   LMP 04/11/2016 (Approximate)   BMI 30.03 kg/m  Patient's last menstrual period was 04/11/2016 (approximate). Psych:  Alert and cooperative. Normal mood and affect. General:   Alert,  Well-developed, well-nourished, pleasant and cooperative in NAD Head:  Normocephalic and atraumatic. Eyes:  Sclera clear, no icterus.   Conjunctiva pink. Ears:  Normal auditory acuity. Lungs:  Respirations even and unlabored.  Clear throughout to auscultation.   No wheezes, crackles, or rhonchi. No acute distress. Heart:  Regular rate and rhythm; no murmurs, clicks, rubs, or gallops. Abdomen:  Normal bowel sounds.  No bruits.  Soft, non-tender and non-distended without masses, hepatosplenomegaly or hernias noted.  No guarding or rebound tenderness.    Neurologic:  Alert and oriented x3;  grossly normal neurologically. Psych:  Alert and cooperative. Normal mood and affect.  Imaging Studies: No results found.  Assessment and Plan:   Santrice Muzio is a 57 y.o. y/o female has been referred for Acid Reflux.  She took OTC low-dose Nexium 20 Mg daily for many years.  Recently stopped PPI 2 weeks ago due to concerns about long-term adverse side effects of PPIs.  She has not had any recent episodes of  acid reflux and is currently doing well.  We discussed lifestyle modification at length.  We discussed scheduling EGD to screen for Barrett's.  Patient will be due for a 5-year repeat colonoscopy 10/2022.  She would like to call and check with her insurance about coverage of procedures before she schedules an EGD and colonoscopy.  She has no alarm symptoms.  GERD Recommend Lifestyle Modifications to prevent Acid Reflux.  Rec. Avoid coffee, sodas, peppermint, citrus fruits, and spicey foods.  Avoid eating 2-3 hours before bedtime.  We discussed adverse side effects of PPIs to include vitamin deficiencies, osteoporosis, renal insufficiency, dementia and increased risk of C. Difficile.  Recommend take lowest effective dose of PPI necessary to control acid reflux.   She will remain off Nexium for now. We discussed trying OTC Pepcid (famotidine) 20 Mg once or twice daily if needed. She can also take  OTC antacids such as TUMS as needed. Patient will let us know if she wants to schedule EGD (to screen for Barrett's) in addition to colonoscopy 10/2017, after she checks her insurance.  2.   History of Colon Polyp  5-year repeat colonoscopy will be due 10/2022.  Last colonoscopy 10/2017 showed 1 small polyp removed.  Follow up in 10/2017 for Colonoscopy with Dr. Tobi Bastos and Possible EGD.  Patient will call back when she is ready to schedule procedures.  Celso Amy, PA-C

## 2022-08-11 NOTE — Patient Instructions (Signed)
You will be due for your colonoscopy in July 2024. Please contact our office when you are ready to schedule.

## 2022-08-25 ENCOUNTER — Encounter: Payer: Self-pay | Admitting: Physician Assistant

## 2022-09-19 ENCOUNTER — Other Ambulatory Visit: Payer: Self-pay

## 2022-09-19 ENCOUNTER — Telehealth: Payer: Self-pay

## 2022-09-19 DIAGNOSIS — Z86010 Personal history of colonic polyps: Secondary | ICD-10-CM

## 2022-09-19 DIAGNOSIS — K219 Gastro-esophageal reflux disease without esophagitis: Secondary | ICD-10-CM

## 2022-09-19 MED ORDER — PEG 3350-KCL-NA BICARB-NACL 420 G PO SOLR: 4000.0000 mL | Freq: Once | ORAL | 0 refills | Status: AC

## 2022-09-19 NOTE — Telephone Encounter (Signed)
Left message with 8-1,8-2 8-5 and 8-6 for dates to have EGD/Colonoscopy done. Patient was asked to return call to office with preferred date.

## 2022-09-19 NOTE — Telephone Encounter (Signed)
Spoke with patient and let her know EGD/Colonoscopy scheduled 11/11/22 with Dr.Anna -she can view her instructions and if she has questions call the office. Golytely sent to pharmacy.

## 2022-09-19 NOTE — Telephone Encounter (Signed)
Patient left a message and would like to schedule her colonoscopy and EGD for 11/11/2022

## 2022-09-21 ENCOUNTER — Other Ambulatory Visit: Payer: Self-pay

## 2022-09-21 MED ORDER — PEG 3350-KCL-NA BICARB-NACL 420 G PO SOLR: 4000.0000 mL | Freq: Once | ORAL | 0 refills | Status: AC

## 2022-10-18 ENCOUNTER — Telehealth: Payer: Self-pay

## 2022-10-18 NOTE — Telephone Encounter (Signed)
Patients colonoscopy w/ EGD has been rescheduled from 11/11/22 due to change with Dr. Johnney Killian scheduled.  Her procedures have been rescheduled to 11/30/22.  Referral updated.  Instructions updated.  Thanks,  [place], [place]

## 2022-11-23 ENCOUNTER — Encounter: Payer: Self-pay | Admitting: Gastroenterology

## 2022-11-29 ENCOUNTER — Encounter: Payer: Self-pay | Admitting: Gastroenterology

## 2022-11-30 ENCOUNTER — Ambulatory Visit: Payer: Managed Care, Other (non HMO) | Admitting: Certified Registered"

## 2022-11-30 ENCOUNTER — Encounter: Payer: Self-pay | Admitting: Gastroenterology

## 2022-11-30 ENCOUNTER — Encounter
Admission: RE | Disposition: A | Discharge status: Home / Self Care | Payer: Self-pay | Source: Home / Self Care | Attending: Gastroenterology

## 2022-11-30 ENCOUNTER — Ambulatory Visit
Admission: RE | Admit: 2022-11-30 | Discharge: 2022-11-30 | Disposition: A | Discharge status: Home / Self Care | Payer: Managed Care, Other (non HMO) | Attending: Gastroenterology | Admitting: Gastroenterology

## 2022-11-30 DIAGNOSIS — Z800 Family history of malignant neoplasm of digestive organs: Secondary | ICD-10-CM | POA: Diagnosis not present

## 2022-11-30 DIAGNOSIS — K449 Diaphragmatic hernia without obstruction or gangrene: Secondary | ICD-10-CM | POA: Diagnosis not present

## 2022-11-30 DIAGNOSIS — D122 Benign neoplasm of ascending colon: Secondary | ICD-10-CM | POA: Insufficient documentation

## 2022-11-30 DIAGNOSIS — Z13810 Encounter for screening for upper gastrointestinal disorder: Secondary | ICD-10-CM | POA: Insufficient documentation

## 2022-11-30 DIAGNOSIS — Z1211 Encounter for screening for malignant neoplasm of colon: Secondary | ICD-10-CM | POA: Diagnosis not present

## 2022-11-30 DIAGNOSIS — K219 Gastro-esophageal reflux disease without esophagitis: Secondary | ICD-10-CM

## 2022-11-30 DIAGNOSIS — Z86010 Personal history of colonic polyps: Secondary | ICD-10-CM | POA: Diagnosis present

## 2022-11-30 HISTORY — PX: POLYPECTOMY: SHX5525

## 2022-11-30 HISTORY — PX: COLONOSCOPY WITH PROPOFOL: SHX5780

## 2022-11-30 HISTORY — PX: ESOPHAGOGASTRODUODENOSCOPY (EGD) WITH PROPOFOL: SHX5813

## 2022-11-30 SURGERY — COLONOSCOPY WITH PROPOFOL
Anesthesia: General

## 2022-11-30 MED ORDER — DEXMEDETOMIDINE HCL IN NACL 80 MCG/20ML IV SOLN
INTRAVENOUS | Status: DC | PRN
Start: 2022-11-30 — End: 2022-11-30
  Administered 2022-11-30: 8 ug via INTRAVENOUS

## 2022-11-30 MED ORDER — LIDOCAINE HCL (CARDIAC) PF 100 MG/5ML IV SOSY
PREFILLED_SYRINGE | INTRAVENOUS | Status: DC | PRN
  Administered 2022-11-30: 100 mg via INTRAVENOUS

## 2022-11-30 MED ORDER — SODIUM CHLORIDE 0.9 % IV SOLN: INTRAVENOUS | Status: DC

## 2022-11-30 MED ORDER — PROPOFOL 500 MG/50ML IV EMUL
INTRAVENOUS | Status: DC | PRN
  Administered 2022-11-30: 150 ug/kg/min via INTRAVENOUS

## 2022-11-30 MED ORDER — GLYCOPYRROLATE 0.2 MG/ML IJ SOLN
INTRAMUSCULAR | Status: DC | PRN
  Administered 2022-11-30: .2 mg via INTRAVENOUS

## 2022-11-30 MED ORDER — PROPOFOL 10 MG/ML IV BOLUS
INTRAVENOUS | Status: DC | PRN
  Administered 2022-11-30: 10 mg via INTRAVENOUS
  Administered 2022-11-30: 90 mg via INTRAVENOUS
  Administered 2022-11-30 (×2): 20 mg via INTRAVENOUS

## 2022-11-30 MED ORDER — PROPOFOL 1000 MG/100ML IV EMUL
INTRAVENOUS | Status: AC
  Filled 2022-11-30: qty 100

## 2022-11-30 MED ORDER — PHENYLEPHRINE 80 MCG/ML (10ML) SYRINGE FOR IV PUSH (FOR BLOOD PRESSURE SUPPORT)
PREFILLED_SYRINGE | INTRAVENOUS | Status: DC | PRN
  Administered 2022-11-30 (×2): 80 ug via INTRAVENOUS

## 2022-11-30 NOTE — Anesthesia Postprocedure Evaluation (Signed)
Anesthesia Post Note  Patient: Christine Schroeder  Procedure(s) Performed: COLONOSCOPY WITH PROPOFOL ESOPHAGOGASTRODUODENOSCOPY (EGD) WITH PROPOFOL POLYPECTOMY  Patient location during evaluation: Endoscopy Anesthesia Type: General Level of consciousness: awake and alert Pain management: pain level controlled Vital Signs Assessment: post-procedure vital signs reviewed and stable Respiratory status: spontaneous breathing, nonlabored ventilation, respiratory function stable and patient connected to nasal cannula oxygen Cardiovascular status: blood pressure returned to baseline and stable Postop Assessment: no apparent nausea or vomiting Anesthetic complications: no   No notable events documented.   Last Vitals:  Vitals:   11/30/22 0818 11/30/22 0828  BP: 99/62 99/66  Pulse: 79 75  Resp: 16 15  Temp:    SpO2: 98% 99%    Last Pain:  Vitals:   11/30/22 0828  TempSrc:   PainSc: 0-No pain                 Cleda Mccreedy Billal Rollo

## 2022-11-30 NOTE — Anesthesia Procedure Notes (Signed)
Procedure Name: MAC Date/Time: 11/30/2022 7:44 AM  Performed by: Cheral Bay, CRNAPre-anesthesia Checklist: Patient identified, Emergency Drugs available, Suction available, Patient being monitored and Timeout performed Patient Re-evaluated:Patient Re-evaluated prior to induction Oxygen Delivery Method: Nasal cannula Induction Type: IV induction Placement Confirmation: positive ETCO2 and CO2 detector

## 2022-11-30 NOTE — Transfer of Care (Signed)
Immediate Anesthesia Transfer of Care Note  Patient: Christine Schroeder  Procedure(s) Performed: COLONOSCOPY WITH PROPOFOL ESOPHAGOGASTRODUODENOSCOPY (EGD) WITH PROPOFOL POLYPECTOMY  Patient Location: PACU and Endoscopy Unit  Anesthesia Type:General  Level of Consciousness: drowsy  Airway & Oxygen Therapy: Patient Spontanous Breathing  Post-op Assessment: Report given to RN and Post -op Vital signs reviewed and stable  Post vital signs: Reviewed and stable  Last Vitals:  Vitals Value Taken Time  BP 95/70 11/30/22 0811  Temp    Pulse 64 11/30/22 0812  Resp 20 11/30/22 0812  SpO2 99 % 11/30/22 0812  Vitals shown include unfiled device data.  Last Pain:  Vitals:   11/30/22 0808  TempSrc: Temporal  PainSc: Asleep         Complications: No notable events documented.

## 2022-11-30 NOTE — Anesthesia Preprocedure Evaluation (Signed)
Anesthesia Evaluation  Patient identified by MRN, date of birth, ID band Patient awake    Reviewed: Allergy & Precautions, NPO status , Patient's Chart, lab work & pertinent test results  History of Anesthesia Complications (+) Family history of anesthesia reaction and history of anesthetic complications  Airway Mallampati: III  TM Distance: >3 FB Neck ROM: full    Dental  (+) Chipped   Pulmonary neg pulmonary ROS, neg shortness of breath   Pulmonary exam normal        Cardiovascular (-) angina negative cardio ROS Normal cardiovascular exam     Neuro/Psych negative neurological ROS  negative psych ROS   GI/Hepatic Neg liver ROS,GERD  Controlled,,  Endo/Other  negative endocrine ROS    Renal/GU negative Renal ROS  negative genitourinary   Musculoskeletal   Abdominal   Peds  Hematology negative hematology ROS (+)   Anesthesia Other Findings Past Medical History: No date: Allergic rhinitis No date: Chickenpox No date: Family history of adverse reaction to anesthesia     Comment:  Mother - PONV No date: GERD (gastroesophageal reflux disease) 09/2019: History of MRSA infection     Comment:  MRSA infection of foot after cortisol injection. No date: Increased BMI  Past Surgical History: 01/17/2020: BUNIONECTOMY; Left     Comment:  Procedure: Donzetta Sprung;  Surgeon: Gwyneth Revels, DPM;  Location: [facility] OR[facility];  Service: Podiatry;                Laterality: Left; 11/03/2017: COLONOSCOPY WITH PROPOFOL; N/A     Comment:  Procedure: COLONOSCOPY WITH PROPOFOL;  Surgeon: Wyline Mood, MD;  Location: [facility] END[facility]COPY;  Service:               Endoscopy;  Laterality: N/A; 12/08/2019: IRRIGATION AND DEBRIDEMENT FOOT; Left     Comment:  Procedure: IRRIGATION AND DEBRIDEMENT FOOT;  Surgeon:               Gwyneth Revels, DPM;  Location: [facility] ORS;  Service:               Podiatry;  Laterality:  Left; 02/10/2016: SHOULDER ARTHROSCOPY; Right     Comment:  Procedure: Limited arthroscopic debridement,               arthroscopic SLAP tear, subacromial decompression,               mini-open rotator cuff repair, and mini-open biceps               tenodesis, right shoulder;  Surgeon: Christena Flake, MD;                Location: [facility];  Service: Orthopedics;                Laterality: Right;  BMI    Body Mass Index: 29.45 kg/m      Reproductive/Obstetrics negative OB ROS                             Anesthesia Physical Anesthesia Plan  ASA: 2  Anesthesia Plan: General   Post-op Pain Management:    Induction: Intravenous  PONV Risk Score and Plan: Propofol infusion and TIVA  Airway Management Planned: Natural Airway and Nasal Cannula  Additional Equipment:   Intra-op Plan:  Post-operative Plan:   Informed Consent: I have reviewed the patients History and Physical, chart, labs and discussed the procedure including the risks, benefits and alternatives for the proposed anesthesia with the patient or authorized representative who has indicated his/her understanding and acceptance.     Dental Advisory Given  Plan Discussed with: Anesthesiologist, CRNA and Surgeon  Anesthesia Plan Comments: (Patient consented for risks of anesthesia including but not limited to:  - adverse reactions to medications - risk of airway placement if required - damage to eyes, teeth, lips or other oral mucosa - nerve damage due to positioning  - sore throat or hoarseness - Damage to heart, brain, nerves, lungs, other parts of body or loss of life  Patient voiced understanding.)       Anesthesia Quick Evaluation

## 2022-11-30 NOTE — Op Note (Signed)
[facility] Patient Name: Christine Schroeder Procedure Date: 11/30/2022 7:22 AM MRN: [id number] Account #: [id number] Date of Birth: 12/02/1965 Admit Type: Outpatient Age: 57 Room: [facility] ENDO ROOM 3 Gender: Female Note Status: Finalized Instrument Name: Upper Endoscope [id number] Procedure:             Upper GI endoscopy Indications:           Screening for Barrett's esophagus Providers:             Wyline Mood MD, MD Medicines:             Monitored Anesthesia Care Complications:         No immediate complications. Procedure:             Pre-Anesthesia Assessment:                        - Prior to the procedure, a History and Physical was                         performed, and patient medications, allergies and                         sensitivities were reviewed. The patient's tolerance                         of previous anesthesia was reviewed.                        - ASA Grade Assessment: II - A patient with mild                         systemic disease.                        After obtaining informed consent, the endoscope was                         passed under direct vision. Throughout the procedure,                         the patient's blood pressure, pulse, and oxygen                         saturations were monitored continuously. The Endoscope                         was introduced through the mouth, and advanced to the                         third part of duodenum. The upper GI endoscopy was                         accomplished with ease. The patient tolerated the                         procedure well. Findings:      A small hiatal hernia was present.      The esophagus was normal.      The gastroesophageal flap valve was visualized endoscopically and  classified as Hill Grade III (minimal fold, loose to endoscope, hiatal       hernia likely).      The examined duodenum was normal.      The cardia and gastric fundus were normal  on retroflexion. Impression:            - Small hiatal hernia.                        - Normal esophagus.                        - Gastroesophageal flap valve classified as Hill Grade                         III (minimal fold, loose to endoscope, hiatal hernia                         likely).                        - Normal examined duodenum.                        - No specimens collected. Recommendation:        - Perform a colonoscopy today. Procedure Code(s):     --- Professional ---                        [phone], Esophagogastroduodenoscopy, flexible,                         transoral; diagnostic, including collection of                         specimen(s) by brushing or washing, when performed                         (separate procedure) Diagnosis Code(s):     --- Professional ---                        K44.9, Diaphragmatic hernia without obstruction or                         gangrene                        Z13.810, Encounter for screening for upper                         gastrointestinal disorder CPT copyright 2022 American Medical Association. All rights reserved. The codes documented in this report are preliminary and upon coder review may  be revised to meet current compliance requirements. Wyline Mood, MD Wyline Mood MD, MD 11/30/2022 7:49:26 AM This report has been signed electronically. Number of Addenda: 0 Note Initiated On: 11/30/2022 7:22 AM Estimated Blood Loss:  Estimated blood loss: none.      [facility]

## 2022-11-30 NOTE — Op Note (Signed)
[facility] Patient Name: Christine Schroeder Procedure Date: 11/30/2022 7:22 AM MRN: [id number] Account #: [id number] Date of Birth: 1965-09-08 Admit Type: Outpatient Age: 57 Room: [facility] ENDO ROOM 3 Gender: Female Note Status: Finalized Instrument Name: Nelda Marseille [id number] Procedure:             Colonoscopy Indications:           Surveillance: Personal history of adenomatous polyps                         on last colonoscopy 5 years ago Providers:             Wyline Mood MD, MD Medicines:             Monitored Anesthesia Care Complications:         No immediate complications. Procedure:             Pre-Anesthesia Assessment:                        - Prior to the procedure, a History and Physical was                         performed, and patient medications, allergies and                         sensitivities were reviewed. The patient's tolerance                         of previous anesthesia was reviewed.                        - The risks and benefits of the procedure and the                         sedation options and risks were discussed with the                         patient. All questions were answered and informed                         consent was obtained.                        - ASA Grade Assessment: II - A patient with mild                         systemic disease.                        After obtaining informed consent, the colonoscope was                         passed under direct vision. Throughout the procedure,                         the patient's blood pressure, pulse, and oxygen                         saturations were monitored continuously. The  Colonoscope was introduced through the anus and                         advanced to the the cecum, identified by the                         appendiceal orifice. The colonoscopy was performed                         with ease. The patient tolerated the  procedure well.                         The quality of the bowel preparation was excellent.                         The ileocecal valve, appendiceal orifice, and rectum                         were photographed. Findings:      The perianal and digital rectal examinations were normal.      Two sessile polyps were found in the ascending colon. The polyps were 5       to 7 mm in size. These polyps were removed with a cold snare. Resection       and retrieval were complete.      The exam was otherwise without abnormality on direct and retroflexion       views. Impression:            - Two 5 to 7 mm polyps in the ascending colon, removed                         with a cold snare. Resected and retrieved.                        - The examination was otherwise normal on direct and                         retroflexion views. Recommendation:        - Discharge patient to home (with escort).                        - Resume previous diet.                        - Continue present medications.                        - Await pathology results.                        - Repeat colonoscopy for surveillance based on                         pathology results. Procedure Code(s):     --- Professional ---                        [phone], Colonoscopy, flexible; with removal of  tumor(s), polyp(s), or other lesion(s) by snare                         technique Diagnosis Code(s):     --- Professional ---                        Z86.010, Personal history of colonic polyps                        D12.2, Benign neoplasm of ascending colon CPT copyright 2022 American Medical Association. All rights reserved. The codes documented in this report are preliminary and upon coder review may  be revised to meet current compliance requirements. Wyline Mood, MD Wyline Mood MD, MD 11/30/2022 8:05:09 AM This report has been signed electronically. Number of Addenda: 0 Note Initiated On: 11/30/2022 7:22  AM Total Procedure Duration: 0 hours 12 minutes 13 seconds  Estimated Blood Loss:  Estimated blood loss: none.      [facility]

## 2022-11-30 NOTE — H&P (Signed)
Wyline Mood, MD [address], [place], [place], [address] [address], [place], [place], [address] Phone: [phone]  Fax: [phone]  Primary Care Physician:  Marguarite Arbour, MD   Pre-Procedure History & Physical: HPI:  Christine Schroeder is a 57 y.o. female is here for an endoscopy and colonoscopy    Past Medical History:  Diagnosis Date   Allergic rhinitis    Chickenpox    Family history of adverse reaction to anesthesia    Mother - PONV   GERD (gastroesophageal reflux disease)    History of MRSA infection 09/2019   MRSA infection of foot after cortisol injection.   Increased BMI     Past Surgical History:  Procedure Laterality Date   BUNIONECTOMY Left 01/17/2020   Procedure: BUNIONECTOMY TAILOR'S;  Surgeon: Gwyneth Revels, DPM;  Location: [facility] ORS;  Service: Podiatry;  Laterality: Left;   COLONOSCOPY WITH PROPOFOL N/A 11/03/2017   Procedure: COLONOSCOPY WITH PROPOFOL;  Surgeon: Wyline Mood, MD;  Location: [facility] ENDOSCOPY;  Service: Endoscopy;  Laterality: N/A;   IRRIGATION AND DEBRIDEMENT FOOT Left 12/08/2019   Procedure: IRRIGATION AND DEBRIDEMENT FOOT;  Surgeon: Gwyneth Revels, DPM;  Location: [facility] ORS;  Service: Podiatry;  Laterality: Left;   SHOULDER ARTHROSCOPY Right 02/10/2016   Procedure: Limited arthroscopic debridement, arthroscopic SLAP tear, subacromial decompression, mini-open rotator cuff repair, and mini-open biceps tenodesis, right shoulder;  Surgeon: Christena Flake, MD;  Location: [facility];  Service: Orthopedics;  Laterality: Right;    Prior to Admission medications   Medication Sig Start Date End Date Taking? Authorizing Provider  calcium carbonate (OS-CAL - DOSED IN MG OF ELEMENTAL CALCIUM) 1250 (500 Ca) MG tablet Take 1 tablet by mouth.   Yes [provider]  cetirizine (ZYRTEC) 10 MG tablet Take 10 mg by mouth. 03/13/18  Yes [provider]  cholecalciferol (VITAMIN D3) 25 MCG (1000 UNIT) tablet  Take 1,000 Units by mouth. 03/13/18  Yes [provider]  Fezolinetant (VEOZAH) 45 MG TABS Take by mouth.   Yes [provider]  magnesium oxide (MAG-OX) 400 (240 Mg) MG tablet Take 400 mg by mouth daily.   Yes [provider]  Multiple Vitamins-Minerals (DRY EYE FORMULA) CAPS Take by mouth.   Yes [provider]  estradiol (ESTRACE) 1 MG tablet TAKE 1 TABLET(1 MG) BY MOUTH DAILY Patient not taking: Reported on 08/11/2022 12/09/21   Hildred Laser, MD  fluorometholone (FML) 0.1 % ophthalmic suspension Place 1 drop into both eyes. Patient not taking: Reported on 08/11/2022 05/17/22   [provider]  Multiple Vitamins-Minerals (HAIR SKIN NAILS PO) Take 2 tablets by mouth daily. Hair/Skin/Nails by VitaFusion Patient not taking: Reported on 08/11/2022    [provider]  neomycin-polymyxin b-dexamethasone (MAXITROL) 3.5-10000-0.1 SUSP Place 1 drop into both eyes. Patient not taking: Reported on 08/11/2022 07/29/22   [provider]  TYRVAYA 0.03 MG/ACT SOLN Place 1 spray into both nostrils 2 (two) times daily. Patient not taking: Reported on 08/11/2022 07/20/22   [provider]    Allergies as of 09/19/2022 - Review Complete 08/11/2022  Allergen Reaction Noted   Penicillins Hives 02/04/2015   Etodolac Other (See Comments) 02/04/2015   Meloxicam Other (See Comments) 02/04/2015   Prednisone Rash 02/04/2015    Family History  Problem Relation Age of Onset   Hypertension Mother    Heart disease Maternal Uncle    Stroke Maternal Grandmother    Diabetes Maternal Grandmother    Diabetes Maternal Grandfather  Colon cancer Maternal Aunt    Cancer Neg Hx     Social History   Socioeconomic History   Marital status: Married    Spouse name: Not on file   Number of children: Not on file   Years of education: Not on file   Highest education level: Not on file  Occupational History   Not on file  Tobacco Use   Smoking status: Never    Smokeless tobacco: Never  Vaping Use   Vaping status: Never Used  Substance and Sexual Activity   Alcohol use: Not Currently    Alcohol/week: 0.0 standard drinks of alcohol    Comment: rarely    Drug use: No   Sexual activity: Yes    Birth control/protection: Post-menopausal  Other Topics Concern   Not on file  Social History Narrative   Married.   No children.   Works for [employer] in [employer].   Enjoys hiking, spending time outdoors, reading, golfing.    Social Determinants of Health   Financial Resource Strain: Not on file  Food Insecurity: Not on file  Transportation Needs: Not on file  Physical Activity: Sufficiently Active (07/12/2017)   Exercise Vital Sign    Days of Exercise per Week: 6 days    Minutes of Exercise per Session: 60 min  Stress: Not on file  Social Connections: Not on file  Intimate Partner Violence: Not on file    Review of Systems: See HPI, otherwise negative ROS  Physical Exam: BP 114/68   Pulse 63   Temp (!) 96.2 F (35.7 C) (Temporal)   Resp 16   Wt 73 kg   LMP 04/11/2016 (Approximate)   SpO2 100%   BMI 29.45 kg/m  General:   Alert,  pleasant and cooperative in NAD Head:  Normocephalic and atraumatic. Neck:  Supple; no masses or thyromegaly. Lungs:  Clear throughout to auscultation, normal respiratory effort.    Heart:  +S1, +S2, Regular rate and rhythm, No edema. Abdomen:  Soft, nontender and nondistended. Normal bowel sounds, without guarding, and without rebound.   Neurologic:  Alert and  oriented x4;  grossly normal neurologically.  Impression/Plan: Christine Schroeder is here for an endoscopy and colonoscopy  to be performed for  evaluation of GERD and colon cancer screening     Risks, benefits, limitations, and alternatives regarding endoscopy have been reviewed with the patient.  Questions have been answered.  All parties agreeable.   Wyline Mood, MD  11/30/2022, 7:41 AM

## 2022-12-01 ENCOUNTER — Encounter: Payer: Self-pay | Admitting: Gastroenterology

## 2022-12-05 ENCOUNTER — Encounter: Payer: Self-pay | Admitting: Gastroenterology

## 2022-12-19 NOTE — Progress Notes (Unsigned)
PCP: Marguarite Arbour, MD   No chief complaint on file.   HPI:      [name] is a 57 y.o. G0P0000 whose LMP was Patient's last menstrual period was 04/11/2016 (approximate)., presents today for her annual examination.  Her menses are {norm/abn:715}, lasting {number: 22536} days.  Dysmenorrhea {dysmen:716}. She {does:18564} have intermenstrual bleeding. She {does:18564} have vasomotor sx.   Sex activity: {sex active: 315163}. She {does:18564} have vaginal dryness.  Last Pap: 12/02/20 Results were: no abnormalities /neg HPV DNA.  Hx of STDs: {STD hx:14358}  Last mammogram: 10/24/22  Results were: normal--routine follow-up in 12 months There is no FH of breast cancer. There is no FH of ovarian cancer. The patient {does:18564} do self-breast exams.  Colonoscopy: 8/24 at [facility] with polyps; Repeat due after 5 years.   Tobacco use: {tob:20664} Alcohol use: {Alcohol:11675} No drug use Exercise: {exercise:31265}  She {does:18564} get adequate calcium and Vitamin D in her diet.  Labs with PCP.   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) 12/10/2019   Left foot infection 12/08/2019   Osteomyelitis (HCC) 12/08/2019   History of MRSA infection 09/2019   Abnormal glucose 07/26/2019   Elevated LDL cholesterol level 07/26/2019   Visual changes 01/07/2019   BPV (benign positional vertigo) 02/22/2018   Myopia of both eyes 07/19/2017   Presbyopia 07/19/2017   Vasomotor symptoms due to menopause 09/14/2016   Incomplete tear of right rotator cuff 01/18/2016   Rotator cuff tendinitis, right 11/13/2015   Tear of right glenoid labrum 11/13/2015   Preventative health care 03/27/2015   Borderline diabetes 03/27/2015   Palpitations 02/04/2015   Allergic rhinitis 02/04/2015   GERD (gastroesophageal reflux disease) 02/04/2015    Past Surgical History:  Procedure Laterality  Date   BUNIONECTOMY Left 01/17/2020   Procedure: BUNIONECTOMY TAILOR'S;  Surgeon: Gwyneth Revels, DPM;  Location: [facility] ORS;  Service: Podiatry;  Laterality: Left;   COLONOSCOPY WITH PROPOFOL N/A 11/03/2017   Procedure: COLONOSCOPY WITH PROPOFOL;  Surgeon: Wyline Mood, MD;  Location: [facility] ENDOSCOPY;  Service: Endoscopy;  Laterality: N/A;   COLONOSCOPY WITH PROPOFOL N/A 11/30/2022   Procedure: COLONOSCOPY WITH PROPOFOL;  Surgeon: Wyline Mood, MD;  Location[facility] END[facility]COPY;  Service: Gastroenterology;  Laterality: N/A;   ESOPHAGOGASTRODUODENOSCOPY (EGD) WITH PROPOFOL N/A 11/30/2022   Procedure: ESOPHAGOGASTRODUODENOSCOPY (EGD) WITH PROPOFOL;  Surgeon: Wyline Mood, MD;  Location: [facility] ENDOSCOPY;  Service: Gastroenterology;  Laterality: N/A;   IRRIGATION AND DEBRIDEMENT FOOT Left 12/08/2019   Procedure: IRRIGATION AND DEBRIDEMENT FOOT;  Surgeon: Gwyneth Revels, DPM;  Location: [facility] ORS;  Service: Podiatry;  Laterality: Left;   POLYPECTOMY  11/30/2022   Procedure: POLYPECTOMY;  Surgeon: Wyline Mood, MD;  Location: [facility] ENDOSCOPY;  Service: Gastroenterology;;   SHOULDER ARTHROSCOPY Right 02/10/2016   Procedure: Limited arthroscopic debridement, arthroscopic SLAP tear, subacromial decompression, mini-open rotator cuff repair, and mini-open biceps tenodesis, right shoulder;  Surgeon: Christena Flake, MD;  Location: [facility];  Service: Orthopedics;  Laterality: Right;    Family History  Problem Relation Age of Onset   Hypertension Mother    Heart disease Maternal Uncle    Stroke Maternal Grandmother    Diabetes Maternal Grandmother    Diabetes Maternal Grandfather    Colon cancer Maternal Aunt    Cancer Neg Hx     Social History   Socioeconomic History   Marital status: Married    Spouse  name: Not on file   Number of children: Not on file   Years of education: Not on file   Highest education level: Not on file  Occupational History   Not on file  Tobacco Use   Smoking status: Never    Smokeless tobacco: Never  Vaping Use   Vaping status: Never Used  Substance and Sexual Activity   Alcohol use: Not Currently    Alcohol/week: 0.0 standard drinks of alcohol    Comment: rarely    Drug use: No   Sexual activity: Yes    Birth control/protection: Post-menopausal  Other Topics Concern   Not on file  Social History Narrative   Married.   No children.   Works for [employer] in [employer].   Enjoys hiking, spending time outdoors, reading, golfing.    Social Determinants of Health   Financial Resource Strain: Not on file  Food Insecurity: Not on file  Transportation Needs: Not on file  Physical Activity: Sufficiently Active (07/12/2017)   Exercise Vital Sign    Days of Exercise per Week: 6 days    Minutes of Exercise per Session: 60 min  Stress: Not on file  Social Connections: Not on file  Intimate Partner Violence: Not on file     Current Outpatient Medications:    calcium carbonate (OS-CAL - DOSED IN MG OF ELEMENTAL CALCIUM) 1250 (500 Ca) MG tablet, Take 1 tablet by mouth., Disp: , Rfl:    cetirizine (ZYRTEC) 10 MG tablet, Take 10 mg by mouth., Disp: , Rfl:    cholecalciferol (VITAMIN D3) 25 MCG (1000 UNIT) tablet, Take 1,000 Units by mouth., Disp: , Rfl:    estradiol (ESTRACE) 1 MG tablet, TAKE 1 TABLET(1 MG) BY MOUTH DAILY (Patient not taking: Reported on 08/11/2022), Disp: 90 tablet, Rfl: 3   Fezolinetant (VEOZAH) 45 MG TABS, Take by mouth., Disp: , Rfl:    fluorometholone (FML) 0.1 % ophthalmic suspension, Place 1 drop into both eyes. (Patient not taking: Reported on 08/11/2022), Disp: , Rfl:    magnesium oxide (MAG-OX) 400 (240 Mg) MG tablet, Take 400 mg by mouth daily., Disp: , Rfl:    Multiple Vitamins-Minerals (DRY EYE FORMULA) CAPS, Take by mouth., Disp: , Rfl:    Multiple Vitamins-Minerals (HAIR SKIN NAILS PO), Take 2 tablets by mouth daily. Hair/Skin/Nails by VitaFusion (Patient not taking: Reported on 08/11/2022), Disp: , Rfl:     neomycin-polymyxin b-dexamethasone (MAXITROL) 3.5-10000-0.1 SUSP, Place 1 drop into both eyes. (Patient not taking: Reported on 08/11/2022), Disp: , Rfl:    TYRVAYA 0.03 MG/ACT SOLN, Place 1 spray into both nostrils 2 (two) times daily. (Patient not taking: Reported on 08/11/2022), Disp: , Rfl:      ROS:  Review of Systems BREAST: No symptoms    Objective: LMP 04/11/2016 (Approximate)    OBGyn Exam  Results: No results found for this or any previous visit (from the past 24 hour(s)).  Assessment/Plan:  No diagnosis found.   No orders of the defined types were placed in this encounter.           GYN counsel {counseling: 16159}    F/U  No follow-ups on file.  Winslow Verrill B. Antwoine Zorn, PA-C 12/19/2022 12:56 PM

## 2022-12-20 ENCOUNTER — Ambulatory Visit: Payer: Managed Care, Other (non HMO) | Admitting: Obstetrics and Gynecology

## 2022-12-20 ENCOUNTER — Encounter: Payer: Self-pay | Admitting: Obstetrics and Gynecology

## 2022-12-20 VITALS — BP 102/70 | Ht 62.0 in | Wt 161.0 lb

## 2022-12-20 DIAGNOSIS — Z01419 Encounter for gynecological examination (general) (routine) without abnormal findings: Secondary | ICD-10-CM

## 2022-12-20 DIAGNOSIS — Z79890 Hormone replacement therapy (postmenopausal): Secondary | ICD-10-CM

## 2022-12-20 DIAGNOSIS — Z1231 Encounter for screening mammogram for malignant neoplasm of breast: Secondary | ICD-10-CM

## 2022-12-20 DIAGNOSIS — Z1211 Encounter for screening for malignant neoplasm of colon: Secondary | ICD-10-CM

## 2022-12-20 DIAGNOSIS — N951 Menopausal and female climacteric states: Secondary | ICD-10-CM

## 2022-12-20 NOTE — Patient Instructions (Signed)
I value your feedback and you entrusting us with your care. If you get a [facility] patient survey, I would appreciate you taking the time to let us know about your experience today. Thank you! ? ? ?

## 2023-03-29 ENCOUNTER — Encounter: Payer: Self-pay | Admitting: Gastroenterology

## 2023-12-25 ENCOUNTER — Ambulatory Visit: Admitting: Obstetrics and Gynecology

## 2023-12-26 ENCOUNTER — Ambulatory Visit: Admitting: Obstetrics and Gynecology

## 2024-01-14 NOTE — Progress Notes (Unsigned)
 PCP: Auston Reyes BIRCH, MD   No chief complaint on file.   HPI:      [name] is a 58 y.o. G0P0000 whose LMP was Patient's last menstrual period was 04/11/2016 (approximate)., presents today for her annual examination.  Her menses are absent due to menopause, no PMB. She does have vasomotor sx, resolved with veozah. Prescribed by Dr. Dorn Pouch. Stopped HRT last yr.   Sex activity: single partner, contraception - post menopausal status. She does have vaginal dryness, improved with lubricants.   Last Pap: 12/02/20 Results were: no abnormalities /neg HPV DNA.   Last mammogram: 10/27/23  Results were: normal--routine follow-up in 12 months There is no FH of breast cancer. There is no FH of ovarian cancer (her aunt had colon cancer that spread to pelvis). The patient does do self-breast exams.  Colonoscopy: 8/24 at [facility] with polyps; Repeat due after 5 years.  DEXA 5/23 at [facility]: osteopenia fem neck.   Tobacco use: The patient denies current or previous tobacco use. Alcohol use: none No drug use Exercise: moderately active  She does get adequate calcium and Vitamin D  in her diet.  Labs with PCP.   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) 12/10/2019   Left foot infection 12/08/2019   Osteomyelitis (HCC) 12/08/2019   History of MRSA infection 09/2019   Abnormal glucose 07/26/2019   Elevated LDL cholesterol level 07/26/2019   Visual changes 01/07/2019   BPV (benign positional vertigo) 02/22/2018   Myopia of both eyes 07/19/2017   Presbyopia 07/19/2017   Vasomotor symptoms due to menopause 09/14/2016   Incomplete tear of right rotator cuff 01/18/2016   Rotator cuff tendinitis, right 11/13/2015   Tear of right glenoid labrum 11/13/2015   Preventative health care 03/27/2015   Borderline diabetes 03/27/2015   Palpitations 02/04/2015   Allergic rhinitis  02/04/2015   GERD (gastroesophageal reflux disease) 02/04/2015    Past Surgical History:  Procedure Laterality Date   BUNIONECTOMY Left 01/17/2020   Procedure: BUNIONECTOMY TAILOR'S;  Surgeon: Ashley Soulier, DPM;  Location: [facility] ORS;  Service: Podiatry;  Laterality: Left;   COLONOSCOPY WITH PROPOFOL  N/A 11/03/2017   Procedure: COLONOSCOPY WITH PROPOFOL ;  Surgeon: Therisa Bi, MD;  Location: [facility] ENDOSCOPY;  Service: Endoscopy;  Laterality: N/A;   COLONOSCOPY WITH PROPOFOL  N/A 11/30/2022   Procedure: COLONOSCOPY WITH PROPOFOL ;  Surgeon: Therisa Bi, MD;  Location: [facility] ENDOSCOPY;  Service: Gastroenterology;  Laterality: N/A;   ESOPHAGOGASTRODUODENOSCOPY (EGD) WITH PROPOFOL  N/A 11/30/2022   Procedure: ESOPHAGOGASTRODUODENOSCOPY (EGD) WITH PROPOFOL ;  Surgeon: Therisa Bi, MD;  Location: [facility] ENDOSCOPY;  Service: Gastroenterology;  Laterality: N/A;   IRRIGATION AND DEBRIDEMENT FOOT Left 12/08/2019   Procedure: IRRIGATION AND DEBRIDEMENT FOOT;  Surgeon: Ashley Soulier, DPM;  Location: [facility] ORS;  Service: Podiatry;  Laterality: Left;   POLYPECTOMY  11/30/2022   Procedure: POLYPECTOMY;  Surgeon: Therisa Bi, MD;  Location: [facility] ENDOSCOPY;  Service: Gastroenterology;;   SHOULDER ARTHROSCOPY Right 02/10/2016   Procedure: Limited arthroscopic debridement, arthroscopic SLAP tear, subacromial decompression, mini-open rotator cuff repair, and mini-open biceps tenodesis, right shoulder;  Surgeon: Norleen JINNY Maltos, MD;  Location: [facility] SURGERY CNTR;  Service: Orthopedics;  Laterality: Right;    Family History  Problem Relation Age of Onset   Hypertension Mother    Stroke Maternal Grandmother    Diabetes Maternal Grandmother    Diabetes Maternal Grandfather    Colon cancer Maternal  Aunt        late 60s, spread to pelvis with hyst   Heart disease Maternal Uncle    Cancer Neg Hx     Social History   Socioeconomic History   Marital status: Married    Spouse name: Not on file   Number of children: Not  on file   Years of education: Not on file   Highest education level: Not on file  Occupational History   Not on file  Tobacco Use   Smoking status: Never   Smokeless tobacco: Never  Vaping Use   Vaping status: Never Used  Substance and Sexual Activity   Alcohol use: Not Currently    Alcohol/week: 0.0 standard drinks of alcohol    Comment: rarely    Drug use: No   Sexual activity: Yes    Birth control/protection: Post-menopausal  Other Topics Concern   Not on file  Social History Narrative   Married.   No children.   Works for [employer] in [employer].   Enjoys hiking, spending time outdoors, reading, golfing.    Social Drivers of Corporate investment banker Strain: Low Risk  (08/07/2023)   Received from [facility]   Overall Financial Resource Strain (CARDIA)    Difficulty of Paying Living Expenses: Not hard at all  Food Insecurity: No Food Insecurity (08/07/2023)   Received from [facility]   Hunger Vital Sign    Within the past 12 months, you worried that your food would run out before you got the money to buy more.: Never true    Within the past 12 months, the food you bought just didn't last and you didn't have money to get more.: Never true  Transportation Needs: No Transportation Needs (08/07/2023)   Received from [facility] - Transportation    In the past 12 months, has lack of transportation kept you from medical appointments or from getting medications?: No    Lack of Transportation (Non-Medical): No  Physical Activity: Sufficiently Active (07/12/2017)   Exercise Vital Sign    Days of Exercise per Week: 6 days    Minutes of Exercise per Session: 60 min  Stress: Not on file  Social Connections: Not on file  Intimate Partner Violence: Not on file     Current Outpatient Medications:    calcium carbonate (OS-CAL - DOSED IN MG OF ELEMENTAL CALCIUM) 1250 (500 Ca) MG tablet, Take 1 tablet by  mouth., Disp: , Rfl:    cholecalciferol (VITAMIN D3) 25 MCG (1000 UNIT) tablet, Take 1,000 Units by mouth., Disp: , Rfl:    Fezolinetant (VEOZAH) 45 MG TABS, Take by mouth., Disp: , Rfl:    magnesium oxide (MAG-OX) 400 (240 Mg) MG tablet, Take 400 mg by mouth daily., Disp: , Rfl:    Multiple Vitamins-Minerals (DRY EYE FORMULA) CAPS, Take by mouth., Disp: , Rfl:    TYRVAYA 0.03 MG/ACT SOLN, Place 1 spray into both nostrils 2 (two) times daily., Disp: , Rfl:      ROS:  Review of Systems  Constitutional:  Negative for fatigue, fever and unexpected weight change.  Respiratory:  Negative for cough, shortness of breath and wheezing.   Cardiovascular:  Negative for chest pain, palpitations and leg swelling.  Gastrointestinal:  Negative for blood in stool, constipation, diarrhea, nausea and vomiting.  Endocrine: Negative for cold intolerance, heat intolerance and polyuria.  Genitourinary:  Negative for dyspareunia, dysuria, flank pain, frequency, genital sores,  hematuria, menstrual problem, pelvic pain, urgency, vaginal bleeding, vaginal discharge and vaginal pain.  Musculoskeletal:  Negative for back pain, joint swelling and myalgias.  Skin:  Negative for rash.  Neurological:  Negative for dizziness, syncope, light-headedness, numbness and headaches.  Hematological:  Negative for adenopathy.  Psychiatric/Behavioral:  Negative for agitation, confusion, sleep disturbance and suicidal ideas. The patient is not nervous/anxious.    BREAST: No symptoms    Objective: LMP 04/11/2016 (Approximate)    Physical Exam Constitutional:      Appearance: She is well-developed.  Genitourinary:     Vulva normal.     Right Labia: No rash, tenderness or lesions.    Left Labia: No tenderness, lesions or rash.    No vaginal discharge, erythema or tenderness.      Right Adnexa: not tender and no mass present.    Left Adnexa: not tender and no mass present.    No cervical friability or polyp.     Uterus  is not enlarged or tender.  Breasts:    Right: No mass, nipple discharge, skin change or tenderness.     Left: No mass, nipple discharge, skin change or tenderness.  Neck:     Thyroid: No thyromegaly.  Cardiovascular:     Rate and Rhythm: Normal rate and regular rhythm.     Heart sounds: Normal heart sounds. No murmur heard. Pulmonary:     Effort: Pulmonary effort is normal.     Breath sounds: Normal breath sounds.  Abdominal:     Palpations: Abdomen is soft.     Tenderness: There is no abdominal tenderness. There is no guarding or rebound.  Musculoskeletal:        General: Normal range of motion.     Cervical back: Normal range of motion.  Lymphadenopathy:     Cervical: No cervical adenopathy.  Neurological:     General: No focal deficit present.     Mental Status: She is alert and oriented to person, place, and time.     Cranial Nerves: No cranial nerve deficit.  Skin:    General: Skin is warm and dry.  Psychiatric:        Mood and Affect: Mood normal.        Behavior: Behavior normal.        Thought Content: Thought content normal.        Judgment: Judgment normal.  Vitals reviewed.    Assessment/Plan:  Encounter for annual routine gynecological examination  Encounter for screening mammogram for malignant neoplasm of breast; pt current on mammo  Vasomotor symptoms due to menopause--doing veozah with sx relief, prescribed by Dr. Trudy.           GYN counsel breast self exam, mammography screening, menopause, adequate intake of calcium and vitamin D , diet and exercise    F/U  No follow-ups on file.  Christine Schroeder B. Jesilyn Easom, PA-C 01/14/2024 9:15 AM

## 2024-01-15 ENCOUNTER — Encounter: Payer: Self-pay | Admitting: Obstetrics and Gynecology

## 2024-01-15 ENCOUNTER — Ambulatory Visit (INDEPENDENT_AMBULATORY_CARE_PROVIDER_SITE_OTHER): Admitting: Obstetrics and Gynecology

## 2024-01-15 VITALS — BP 115/73 | HR 68 | Ht 62.0 in | Wt 173.0 lb

## 2024-01-15 DIAGNOSIS — Z79890 Hormone replacement therapy (postmenopausal): Secondary | ICD-10-CM | POA: Diagnosis not present

## 2024-01-15 DIAGNOSIS — Z1151 Encounter for screening for human papillomavirus (HPV): Secondary | ICD-10-CM | POA: Diagnosis not present

## 2024-01-15 DIAGNOSIS — Z01419 Encounter for gynecological examination (general) (routine) without abnormal findings: Secondary | ICD-10-CM

## 2024-01-15 DIAGNOSIS — Z1231 Encounter for screening mammogram for malignant neoplasm of breast: Secondary | ICD-10-CM

## 2024-01-15 DIAGNOSIS — Z124 Encounter for screening for malignant neoplasm of cervix: Secondary | ICD-10-CM

## 2024-01-15 NOTE — Patient Instructions (Signed)
 I value your feedback and you entrusting [place] with your care. If you get a [place] patient survey, I would appreciate you taking the time to let us know about your experience today. Thank you! ? ? ?

## 2024-01-19 LAB — IGP, APTIMA HPV: HPV Aptima: NEGATIVE

## 2024-05-01 ENCOUNTER — Inpatient Hospital Stay

## 2024-05-01 ENCOUNTER — Encounter: Payer: Self-pay | Admitting: Oncology

## 2024-05-01 ENCOUNTER — Inpatient Hospital Stay: Attending: Oncology | Admitting: Oncology

## 2024-05-01 VITALS — BP 110/77 | HR 70 | Temp 97.7°F | Resp 19 | Ht 62.0 in | Wt 174.9 lb

## 2024-05-01 DIAGNOSIS — Z800 Family history of malignant neoplasm of digestive organs: Secondary | ICD-10-CM | POA: Diagnosis not present

## 2024-05-01 DIAGNOSIS — D472 Monoclonal gammopathy: Secondary | ICD-10-CM | POA: Diagnosis not present

## 2024-05-01 DIAGNOSIS — R778 Other specified abnormalities of plasma proteins: Secondary | ICD-10-CM | POA: Diagnosis not present

## 2024-05-01 DIAGNOSIS — Z79890 Hormone replacement therapy (postmenopausal): Secondary | ICD-10-CM | POA: Diagnosis not present

## 2024-05-01 NOTE — Progress Notes (Unsigned)
 "  Hematology/Oncology Consult note [facility] Telephone:([phone] [phone]  Patient Care Team: Auston Reyes BIRCH, MD as PCP - General (Internal Medicine)   Name of the patient: Christine Schroeder  [id number]  January 31, 1966    Reason for referral-abnormal SPEP   Referring physician-Kaitlin Caffaro, PA  Date of visit: 05/01/24   History of presenting illness- Patient is a 59 year old female with a past medical history significant for borderline diabetes, history of BPPV was seen by Dr. Maree neurology for symptoms of peripheral neuropathy.  As a part of the workup she had SPEP done which showed mildly elevated IgM of 223 with an M spike of 0.5 IgM kappa and hence patient referred for abnormal SPEP I do not have any recent CBC available for her and then back from April 2024 when it was unremarkable CMP at that time was unremarkable as well.  Patient reports having fatigue needles and pins sensation especially after taking a bath which started in her lower extremities and now has progressed to her arms and between her shoulders.  Symptoms have been going on for a couple of years now but gradually worsening.   She has not reported lymphadenopathy, splenomegaly, significant anemia, thrombocytopenia, unexplained weight loss, drenching night sweats, or unexplained fatigue. Laboratory studies, including CBC and renal function in April 2024, were within normal limits. There have been no new symptoms or changes since the initial evaluation.       ECOG PS- 0  Pain scale- 0   Review of systems- Review of Systems  Constitutional:  Negative for chills, fever, malaise/fatigue and weight loss.  HENT:  Negative for congestion, ear discharge and nosebleeds.   Eyes:  Negative for blurred vision.  Respiratory:  Negative for cough, hemoptysis, sputum production, shortness of breath and wheezing.   Cardiovascular:  Negative for chest pain, palpitations, orthopnea and  claudication.  Gastrointestinal:  Negative for abdominal pain, blood in stool, constipation, diarrhea, heartburn, melena, nausea and vomiting.  Genitourinary:  Negative for dysuria, flank pain, frequency, hematuria and urgency.  Musculoskeletal:  Negative for back pain, joint pain and myalgias.  Skin:  Negative for rash.  Neurological:  Positive for sensory change (Peripheral neuropathy). Negative for dizziness, tingling, focal weakness, seizures, weakness and headaches.  Endo/Heme/Allergies:  Does not bruise/bleed easily.  Psychiatric/Behavioral:  Negative for depression and suicidal ideas. The patient does not have insomnia.     Allergies[1]  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) 12/10/2019   Left foot infection 12/08/2019   Osteomyelitis (HCC) 12/08/2019   History of MRSA infection 09/2019   Abnormal glucose 07/26/2019   Elevated LDL cholesterol level 07/26/2019   Visual changes 01/07/2019   BPV (benign positional vertigo) 02/22/2018   Myopia of both eyes 07/19/2017   Presbyopia 07/19/2017   Vasomotor symptoms due to menopause 09/14/2016   Incomplete tear of right rotator cuff 01/18/2016   Rotator cuff tendinitis, right 11/13/2015   Tear of right glenoid labrum 11/13/2015   Preventative health care 03/27/2015   Borderline diabetes 03/27/2015   Palpitations 02/04/2015   Allergic rhinitis 02/04/2015   GERD (gastroesophageal reflux disease) 02/04/2015     Past Medical History:  Diagnosis Date   Allergic rhinitis    Chickenpox    Family history of adverse reaction to anesthesia    Mother - PONV   GERD (gastroesophageal reflux disease)    History of MRSA infection  09/2019   MRSA infection of foot after cortisol injection.   Increased BMI      Past Surgical History:  Procedure Laterality Date   BUNIONECTOMY Left 01/17/2020   Procedure: BUNIONECTOMY TAILOR'S;   Surgeon: Ashley Soulier, DPM;  Location: [facility] ORS;  Service: Podiatry;  Laterality: Left;   COLONOSCOPY WITH PROPOFOL  N/A 11/03/2017   Procedure: COLONOSCOPY WITH PROPOFOL ;  Surgeon: Therisa Bi, MD;  Location: [facility] ENDOSCOPY;  Service: Endoscopy;  Laterality: N/A;   COLONOSCOPY WITH PROPOFOL  N/A 11/30/2022   Procedure: COLONOSCOPY WITH PROPOFOL ;  Surgeon: Therisa Bi, MD;  Location: [facility] ENDOSCOPY;  Service: Gastroenterology;  Laterality: N/A;   ESOPHAGOGASTRODUODENOSCOPY (EGD) WITH PROPOFOL  N/A 11/30/2022   Procedure: ESOPHAGOGASTRODUODENOSCOPY (EGD) WITH PROPOFOL ;  Surgeon: Therisa Bi, MD;  Location: [facility] ENDOSCOPY;  Service: Gastroenterology;  Laterality: N/A;   IRRIGATION AND DEBRIDEMENT FOOT Left 12/08/2019   Procedure: IRRIGATION AND DEBRIDEMENT FOOT;  Surgeon: Ashley Soulier, DPM;  Location: [facility] ORS;  Service: Podiatry;  Laterality: Left;   POLYPECTOMY  11/30/2022   Procedure: POLYPECTOMY;  Surgeon: Therisa Bi, MD;  Location: [facility] ENDOSCOPY;  Service: Gastroenterology;;   SHOULDER ARTHROSCOPY Right 02/10/2016   Procedure: Limited arthroscopic debridement, arthroscopic SLAP tear, subacromial decompression, mini-open rotator cuff repair, and mini-open biceps tenodesis, right shoulder;  Surgeon: Norleen JINNY Maltos, MD;  Location: [facility];  Service: Orthopedics;  Laterality: Right;    Social History   Socioeconomic History   Marital status: Married    Spouse name: Not on file   Number of children: Not on file   Years of education: Not on file   Highest education level: Not on file  Occupational History   Not on file  Tobacco Use   Smoking status: Never   Smokeless tobacco: Never  Vaping Use   Vaping status: Never Used  Substance and Sexual Activity   Alcohol use: Not Currently   Drug use: No   Sexual activity: Yes    Birth control/protection: Post-menopausal  Other Topics Concern   Not on file  Social History Narrative   Married.   No children.   Works for [employer]  in [employer].   Enjoys hiking, spending time outdoors, reading, golfing.    Social Drivers of Health   Tobacco Use: Low Risk  (04/18/2024)   Received from [facility]   Patient History    Smoking Tobacco Use: Never    Smokeless Tobacco Use: Never    Passive Exposure: Not on file  Financial Resource Strain: Low Risk  (02/26/2024)   Received from [facility]   Overall Financial Resource Strain (CARDIA)    Difficulty of Paying Living Expenses: Not hard at all  Food Insecurity: No Food Insecurity (04/28/2024)   Epic    Worried About Running Out of Food in the Last Year: Never true    Ran Out of Food in the Last Year: Never true  Transportation Needs: No Transportation Needs (04/28/2024)   Epic    Lack of Transportation (Medical): No    Lack of Transportation (Non-Medical): No  Physical Activity: Not on file  Stress: Not on file  Social Connections: Not on file  Intimate Partner Violence: Not on file  Depression (PHQ2-9): Low Risk (12/14/2021)   Depression (PHQ2-9)    PHQ-2 Score: 0  Alcohol Screen: Not on file  Housing: Low Risk (04/28/2024)   Epic    Unable to Pay for Housing in the Last Year: No    Number of Times Moved in the  Last Year: 0    Homeless in the Last Year: No  Utilities: Not At Risk (04/28/2024)   Epic    Threatened with loss of utilities: No  Health Literacy: Not on file     Family History  Problem Relation Age of Onset   Hypertension Mother    Stroke Maternal Grandmother    Diabetes Maternal Grandmother    Diabetes Maternal Grandfather    Colon cancer Maternal Aunt        late 60s, spread to pelvis with hyst   Heart disease Maternal Uncle    Cancer Neg Hx     Current Medications[2]   Physical exam:  Vitals:   05/01/24 1350  BP: 110/77  Pulse: 70  Resp: 19  Temp: 97.7 F (36.5 C)  TempSrc: Tympanic  SpO2: 99%  Weight: 174 lb 14.4 oz (79.3 kg)  Height: 5' 2 (1.575 m)   Physical  Exam Cardiovascular:     Rate and Rhythm: Normal rate and regular rhythm.     Heart sounds: Normal heart sounds.  Pulmonary:     Effort: Pulmonary effort is normal.     Breath sounds: Normal breath sounds.  Abdominal:     General: Bowel sounds are normal. There is no distension.     Palpations: Abdomen is soft.     Tenderness: There is no abdominal tenderness.  Skin:    General: Skin is warm and dry.  Neurological:     Mental Status: She is alert and oriented to person, place, and time.           Latest Ref Rng & Units 12/10/2019    6:42 AM  CMP  Creatinine 0.44 - 1.00 mg/dL 9.27       Latest Ref Rng & Units 12/07/2019    7:30 PM  CBC  WBC 4.0 - 10.5 K/uL 5.6   Hemoglobin 12.0 - 15.0 g/dL 85.7   Hematocrit 63.9 - 46.0 % 40.4   Platelets 150 - 400 K/uL 238     Assessment and plan- Patient is a 59 y.o. 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  This falls in the category of low risk IgM MGUS which can be monitored conservatively without the need for a bone marrow biopsy.  Requires surveillance for progression to Waldenstrom's macroglobulinemia or other disorders. - Ordered CBC, kidney function tests, M protein level, kappa/lambda light chain assay, and random urine sample for proteinuria. - Monitor M protein and related labs every six months. - I will see her back in 3 weeks to discuss the results of blood work   Thank you for this kind referral and the opportunity to participate in the care of this patient   Visit Diagnosis 1. MGUS (monoclonal gammopathy of unknown significance)   2. Abnormal SPEP     Dr. Annah Skene, MD, MPH [facility] [phone] 05/01/2024                    [1]  Allergies Allergen Reactions   Penicillins Hives   Etodolac Other  (See Comments)    Abdominal pain   Meloxicam Other (See Comments)    Abdominal pain   Prednisone Rash    Abdominal pain  [2]  Current Outpatient Medications:    calcium carbonate (OS-CAL - DOSED IN MG OF ELEMENTAL CALCIUM) 1250 (  500 Ca) MG tablet, Take 1 tablet by mouth., Disp: , Rfl:    cholecalciferol (VITAMIN D3) 25 MCG (1000 UNIT) tablet, Take 1,000 Units by mouth., Disp: , Rfl:    estradiol  (ESTRACE ) 0.1 MG/GM vaginal cream, Place vaginally., Disp: , Rfl:    estradiol  (VIVELLE -DOT) 0.0375 MG/24HR, 1 patch 2 (two) times a week., Disp: , Rfl:    famotidine (PEPCID) 10 MG tablet, Take 10 mg by mouth., Disp: , Rfl:    magnesium oxide (MAG-OX) 400 (240 Mg) MG tablet, Take 400 mg by mouth daily., Disp: , Rfl:    Multiple Vitamins-Minerals (DRY EYE FORMULA) CAPS, Take by mouth., Disp: , Rfl:    Omega-3 1000 MG CAPS, Take by mouth., Disp: , Rfl:    progesterone (PROMETRIUM) 100 MG capsule, Take 100 mg by mouth at bedtime., Disp: , Rfl:   "

## 2024-05-01 NOTE — Progress Notes (Unsigned)
 New patient; Ref -CAFFARO, KAITLIN dx- Abnormal SIEP.

## 2024-05-08 ENCOUNTER — Encounter: Payer: Self-pay | Admitting: Oncology

## 2024-05-16 ENCOUNTER — Encounter: Payer: Self-pay | Admitting: Oncology

## 2024-05-16 ENCOUNTER — Inpatient Hospital Stay: Attending: Oncology | Admitting: Oncology

## 2024-05-16 NOTE — Progress Notes (Unsigned)
 Patient doing good; no new or acute concerns at this time.

## 2024-05-17 ENCOUNTER — Inpatient Hospital Stay: Attending: Oncology | Admitting: Oncology

## 2024-05-17 DIAGNOSIS — D472 Monoclonal gammopathy: Secondary | ICD-10-CM

## 2024-05-17 NOTE — Telephone Encounter (Signed)
 Pt had apt as scheduled.

## 2024-05-17 NOTE — Progress Notes (Signed)
 I connected with Christine Schroeder on 05/17/24 at  9:30 AM EST by video enabled telemedicine visit and verified that I am speaking with the correct person using two identifiers.   I discussed the limitations, risks, security and privacy concerns of performing an evaluation and management service by telemedicine and the availability of in-person appointments. I also discussed with the patient that there may be a patient responsible charge related to this service. The patient expressed understanding and agreed to proceed.  Other persons participating in the visit and their role in the encounter:  none  Patient's location:  home Provider's location:  work  Stage Manager Complaint:  discuss results of bloodwork  Diagnosis: IgM MGUS  History of present illness: Patient is a 59 year old female with a past medical history significant for borderline diabetes, history of BPPV was seen by Dr. Maree neurology for symptoms of peripheral neuropathy.  As a part of the workup she had SPEP done which showed mildly elevated IgM of 223 with an M spike of 0.5 IgM kappa and hence patient referred for abnormal SPEP I do not have any recent CBC available for her and then back from April 2024 when it was unremarkable CMP at that time was unremarkable as well.  Patient reports having fatigue needles and pins sensation especially after taking a bath which started in her lower extremities and now has progressed to her arms and between her shoulders.  Symptoms have been going on for a couple of years now but gradually worsening.     She has not reported lymphadenopathy, splenomegaly, significant anemia, thrombocytopenia, unexplained weight loss, drenching night sweats, or unexplained fatigue. Laboratory studies, including CBC and renal function in April 2024, were within normal limits. There have been no new symptoms or changes since the initial evaluation.     Results of MGUS workup done at [facility] on 05/02/2024 showed mildly elevated  IgM level of 229 with an M protein of 0.4 IgM kappa.  Kappa and lambda light chains were normal with a normal free light chain ratio.  CBC CMP within normal limits.  B12 and folate levels were normal.  Interval history no acute changes since last visit.  Overall patient is doing well and continues to follow-up with neurology for neuropathy workup.  Intermittent fatigue.   Review of Systems  Constitutional:  Positive for malaise/fatigue. Negative for chills, fever and weight loss.  HENT:  Negative for congestion, ear discharge and nosebleeds.   Eyes:  Negative for blurred vision.  Respiratory:  Negative for cough, hemoptysis, sputum production, shortness of breath and wheezing.   Cardiovascular:  Negative for chest pain, palpitations, orthopnea and claudication.  Gastrointestinal:  Negative for abdominal pain, blood in stool, constipation, diarrhea, heartburn, melena, nausea and vomiting.  Genitourinary:  Negative for dysuria, flank pain, frequency, hematuria and urgency.  Musculoskeletal:  Negative for back pain, joint pain and myalgias.  Skin:  Negative for rash.  Neurological:  Positive for sensory change (Peripheral neuropathy). Negative for dizziness, tingling, focal weakness, seizures, weakness and headaches.  Endo/Heme/Allergies:  Does not bruise/bleed easily.  Psychiatric/Behavioral:  Negative for depression and suicidal ideas. The patient does not have insomnia.     Allergies[1]  Past Medical History:  Diagnosis Date   Allergic rhinitis    Chickenpox    Family history of adverse reaction to anesthesia    Mother - PONV   GERD (gastroesophageal reflux disease)    History of MRSA infection 09/2019   MRSA infection of foot after cortisol injection.  Increased BMI     Past Surgical History:  Procedure Laterality Date   BUNIONECTOMY Left 01/17/2020   Procedure: BUNIONECTOMY TAILOR'S;  Surgeon: Ashley Soulier, DPM;  Location: [facility] ORS;  Service: Podiatry;  Laterality: Left;    COLONOSCOPY WITH PROPOFOL  N/A 11/03/2017   Procedure: COLONOSCOPY WITH PROPOFOL ;  Surgeon: Therisa Bi, MD;  Location: [facility] ENDOSCOPY;  Service: Endoscopy;  Laterality: N/A;   COLONOSCOPY WITH PROPOFOL  N/A 11/30/2022   Procedure: COLONOSCOPY WITH PROPOFOL ;  Surgeon: Therisa Bi, MD;  Location: [facility] ENDOSCOPY;  Service: Gastroenterology;  Laterality: N/A;   ESOPHAGOGASTRODUODENOSCOPY (EGD) WITH PROPOFOL  N/A 11/30/2022   Procedure: ESOPHAGOGASTRODUODENOSCOPY (EGD) WITH PROPOFOL ;  Surgeon: Therisa Bi, MD;  Location: [facility] ENDOSCOPY;  Service: Gastroenterology;  Laterality: N/A;   IRRIGATION AND DEBRIDEMENT FOOT Left 12/08/2019   Procedure: IRRIGATION AND DEBRIDEMENT FOOT;  Surgeon: Ashley Soulier, DPM;  Location: [facility] ORS;  Service: Podiatry;  Laterality: Left;   POLYPECTOMY  11/30/2022   Procedure: POLYPECTOMY;  Surgeon: Therisa Bi, MD;  Location: [facility] ENDOSCOPY;  Service: Gastroenterology;;   SHOULDER ARTHROSCOPY Right 02/10/2016   Procedure: Limited arthroscopic debridement, arthroscopic SLAP tear, subacromial decompression, mini-open rotator cuff repair, and mini-open biceps tenodesis, right shoulder;  Surgeon: Norleen JINNY Maltos, MD;  Location: [facility];  Service: Orthopedics;  Laterality: Right;    Social History   Socioeconomic History   Marital status: Married    Spouse name: Margaretha Mahan   Number of children: Not on file   Years of education: Not on file   Highest education level: Not on file  Occupational History   Not on file  Tobacco Use   Smoking status: Never   Smokeless tobacco: Never  Vaping Use   Vaping status: Never Used  Substance and Sexual Activity   Alcohol use: Not Currently   Drug use: No   Sexual activity: Yes    Birth control/protection: Post-menopausal  Other Topics Concern   Not on file  Social History Narrative   Married.   No children.   Works for [employer] in [employer].   Enjoys hiking, spending time outdoors, reading, golfing.     Social Drivers of Health   Tobacco Use: Low Risk (05/01/2024)   Patient History    Smoking Tobacco Use: Never    Smokeless Tobacco Use: Never    Passive Exposure: Not on file  Financial Resource Strain: Low Risk  (02/26/2024)   Received from [facility]   Overall Financial Resource Strain (CARDIA)    Difficulty of Paying Living Expenses: Not hard at all  Food Insecurity: No Food Insecurity (05/01/2024)   Epic    Worried About Running Out of Food in the Last Year: Never true    Ran Out of Food in the Last Year: Never true  Transportation Needs: No Transportation Needs (05/01/2024)   Epic    Lack of Transportation (Medical): No    Lack of Transportation (Non-Medical): No  Physical Activity: Not on file  Stress: Not on file  Social Connections: Not on file  Intimate Partner Violence: Not At Risk (05/01/2024)   Epic    Fear of Current or Ex-Partner: No    Emotionally Abused: No    Physically Abused: No    Sexually Abused: No  Depression (PHQ2-9): Low Risk (05/16/2024)   Depression (PHQ2-9)    PHQ-2 Score: 0  Alcohol Screen: Not on file  Housing: Low Risk (05/01/2024)   Epic    Unable to Pay for Housing in the Last Year: No  Number of Times Moved in the Last Year: 0    Homeless in the Last Year: No  Utilities: Not At Risk (05/01/2024)   Epic    Threatened with loss of utilities: No  Health Literacy: Not on file    Family History  Problem Relation Age of Onset   Hypertension Mother    Stroke Maternal Grandmother    Diabetes Maternal Grandmother    Diabetes Maternal Grandfather    Colon cancer Maternal Aunt        late 60s, spread to pelvis with hyst   Heart disease Maternal Uncle    Cancer Neg Hx     Current Medications[2]  No results found.  No images are attached to the encounter.      Latest Ref Rng & Units 12/10/2019    6:42 AM  CMP  Creatinine 0.44 - 1.00 mg/dL 9.27       Latest Ref Rng & Units 12/07/2019    7:30 PM  CBC  WBC 4.0 -  10.5 K/uL 5.6   Hemoglobin 12.0 - 15.0 g/dL 85.7   Hematocrit 63.9 - 46.0 % 40.4   Platelets 150 - 400 K/uL 238      Observation/objective: Appears in no acute distress over video visit today.  Breathing is nonlabored  Assessment and plan: Patient is a 59 year old female referred for IgM kappa MGUS  Discussed the results of MGUS workup which shows a small amount of IgM kappa M protein of 0.4 g.  Clinically on my prior visit patient did not have any evidence of adenopathy splenomegaly or B symptoms.  No evidence of anemia and her total protein and kidney levels are normal.  No evidence of significant cytopenias.  Patient has a  low risk IgM MGUS which does not require bone marrow biopsy at this time.  CBC with differential CMP myeloma panel and serum free light chains in 6 months in 1 year and I will see her back in 1 year.  I have educated the patient about symptoms of Waldenstrm's macroglobulinemia which include all but not limited to headache dizziness blurry vision when there is significant increase in the IgM M protein along with lymphadenopathy hepatosplenomegaly cytopenias and B symptoms which the patient does not have  Follow-up instructions: As above  I discussed the assessment and treatment plan with the patient. The patient was provided an opportunity to ask questions and all were answered. The patient agreed with the plan and demonstrated an understanding of the instructions.   The patient was advised to call back or seek an in-person evaluation if the symptoms worsen or if the condition fails to improve as anticipated.  I provided 11 minutes of face-to-face video visit time during this encounter, and > 50% was spent counseling as documented under my assessment & plan.  Visit Diagnosis: 1. MGUS (monoclonal gammopathy of unknown significance)     Dr. Annah Skene, MD, MPH [facility] Tel- [phone] 05/17/2024 9:29 AM     [1]  Allergies Allergen  Reactions   Penicillins Hives   Etodolac Other (See Comments)    Abdominal pain   Meloxicam Other (See Comments)    Abdominal pain   Prednisone Rash    Abdominal pain  [2]  Current Outpatient Medications:    calcium carbonate (OS-CAL - DOSED IN MG OF ELEMENTAL CALCIUM) 1250 (500 Ca) MG tablet, Take 1 tablet by mouth., Disp: , Rfl:    cholecalciferol (VITAMIN D3) 25 MCG (1000 UNIT) tablet, Take 1,000  Units by mouth. (Patient not taking: Reported on 05/01/2024), Disp: , Rfl:    estradiol  (ESTRACE ) 0.1 MG/GM vaginal cream, Place vaginally., Disp: , Rfl:    estradiol  (VIVELLE -DOT) 0.0375 MG/24HR, 1 patch 2 (two) times a week., Disp: , Rfl:    famotidine (PEPCID) 10 MG tablet, Take 10 mg by mouth., Disp: , Rfl:    magnesium oxide (MAG-OX) 400 (240 Mg) MG tablet, Take 400 mg by mouth daily., Disp: , Rfl:    Multiple Vitamins-Minerals (DRY EYE FORMULA) CAPS, Take by mouth., Disp: , Rfl:    Omega-3 1000 MG CAPS, Take by mouth., Disp: , Rfl:    progesterone (PROMETRIUM) 100 MG capsule, Take 100 mg by mouth at bedtime., Disp: , Rfl:    progesterone (PROMETRIUM) 200 MG capsule, Take 200 mg by mouth at bedtime., Disp: , Rfl:    tretinoin (RETIN-A) 0.025 % cream, SMARTSIG:2-3 Times Weekly, Disp: , Rfl:

## 2024-05-29 ENCOUNTER — Inpatient Hospital Stay: Admitting: Oncology

## 2025-05-16 ENCOUNTER — Inpatient Hospital Stay: Admitting: Oncology
